# Patient Record
Sex: Female | Born: 1960
Health system: Southern US, Community
[De-identification: ages and names within clinical notes are randomized; demographics above are authoritative.]

## PROBLEM LIST (undated history)

## (undated) DIAGNOSIS — U071 COVID-19: Secondary | ICD-10-CM

## (undated) DIAGNOSIS — E785 Hyperlipidemia, unspecified: Secondary | ICD-10-CM

## (undated) DIAGNOSIS — G8929 Other chronic pain: Secondary | ICD-10-CM

## (undated) DIAGNOSIS — M25579 Pain in unspecified ankle and joints of unspecified foot: Secondary | ICD-10-CM

## (undated) DIAGNOSIS — S3210XA Unspecified fracture of sacrum, initial encounter for closed fracture: Secondary | ICD-10-CM

## (undated) HISTORY — DX: Unspecified fracture of sacrum, initial encounter for closed fracture: S32.10XA

## (undated) HISTORY — DX: Hyperlipidemia, unspecified: E78.5

## (undated) HISTORY — DX: COVID-19: U07.1

## (undated) HISTORY — DX: Other chronic pain: G89.29

## (undated) HISTORY — DX: Pain in unspecified ankle and joints of unspecified foot: M25.579

---

## 1986-08-05 HISTORY — PX: APPENDECTOMY: SHX54

## 2014-11-15 ENCOUNTER — Encounter: Payer: Self-pay | Admitting: *Deleted

## 2015-06-22 ENCOUNTER — Other Ambulatory Visit (HOSPITAL_COMMUNITY)
Admission: RE | Admit: 2015-06-22 | Discharge: 2015-06-22 | Disposition: A | Discharge status: Home / Self Care | Payer: 59 | Source: Ambulatory Visit | Attending: Family Medicine | Admitting: Family Medicine

## 2015-06-22 ENCOUNTER — Encounter: Payer: Self-pay | Admitting: Family Medicine

## 2015-06-22 ENCOUNTER — Ambulatory Visit (INDEPENDENT_AMBULATORY_CARE_PROVIDER_SITE_OTHER)
Admission: RE | Admit: 2015-06-22 | Discharge: 2015-06-22 | Disposition: A | Discharge status: Home / Self Care | Payer: 59 | Source: Ambulatory Visit | Attending: Family Medicine | Admitting: Family Medicine

## 2015-06-22 ENCOUNTER — Ambulatory Visit (INDEPENDENT_AMBULATORY_CARE_PROVIDER_SITE_OTHER): Payer: 59 | Admitting: Family Medicine

## 2015-06-22 VITALS — BP 112/68 | HR 82 | Temp 98.7°F | Ht 60.0 in | Wt 108.1 lb

## 2015-06-22 DIAGNOSIS — Z1151 Encounter for screening for human papillomavirus (HPV): Secondary | ICD-10-CM | POA: Insufficient documentation

## 2015-06-22 DIAGNOSIS — Z130 Encounter for screening for diseases of the blood and blood-forming organs and certain disorders involving the immune mechanism: Secondary | ICD-10-CM | POA: Diagnosis not present

## 2015-06-22 DIAGNOSIS — Z01419 Encounter for gynecological examination (general) (routine) without abnormal findings: Secondary | ICD-10-CM | POA: Diagnosis present

## 2015-06-22 DIAGNOSIS — M25572 Pain in left ankle and joints of left foot: Secondary | ICD-10-CM | POA: Diagnosis not present

## 2015-06-22 DIAGNOSIS — Z0000 Encounter for general adult medical examination without abnormal findings: Secondary | ICD-10-CM | POA: Diagnosis not present

## 2015-06-22 DIAGNOSIS — M545 Low back pain, unspecified: Secondary | ICD-10-CM

## 2015-06-22 DIAGNOSIS — Z1159 Encounter for screening for other viral diseases: Secondary | ICD-10-CM | POA: Diagnosis not present

## 2015-06-22 DIAGNOSIS — Z13220 Encounter for screening for lipoid disorders: Secondary | ICD-10-CM | POA: Diagnosis not present

## 2015-06-22 DIAGNOSIS — G8929 Other chronic pain: Secondary | ICD-10-CM

## 2015-06-22 DIAGNOSIS — Z124 Encounter for screening for malignant neoplasm of cervix: Secondary | ICD-10-CM | POA: Diagnosis not present

## 2015-06-22 DIAGNOSIS — M25579 Pain in unspecified ankle and joints of unspecified foot: Secondary | ICD-10-CM

## 2015-06-22 DIAGNOSIS — Z1239 Encounter for other screening for malignant neoplasm of breast: Secondary | ICD-10-CM

## 2015-06-22 HISTORY — DX: Other chronic pain: G89.29

## 2015-06-22 LAB — COMPREHENSIVE METABOLIC PANEL
ALK PHOS: 59 U/L (ref 39–117)
ALT: 20 U/L (ref 0–35)
AST: 24 U/L (ref 0–37)
Albumin: 4.8 g/dL (ref 3.5–5.2)
BILIRUBIN TOTAL: 0.9 mg/dL (ref 0.2–1.2)
BUN: 15 mg/dL (ref 6–23)
CO2: 29 mEq/L (ref 19–32)
CREATININE: 0.67 mg/dL (ref 0.40–1.20)
Calcium: 10.4 mg/dL (ref 8.4–10.5)
Chloride: 103 mEq/L (ref 96–112)
GFR: 96.59 mL/min (ref >60.00)
GLUCOSE: 100 mg/dL — AB (ref 70–99)
Potassium: 4.4 mEq/L (ref 3.5–5.1)
Sodium: 139 mEq/L (ref 135–145)
TOTAL PROTEIN: 8.3 g/dL (ref 6.0–8.3)

## 2015-06-22 LAB — CBC
HCT: 39.9 % (ref 36.0–46.0)
HEMOGLOBIN: 13.2 g/dL (ref 12.0–15.0)
MCHC: 33.1 g/dL (ref 30.0–36.0)
MCV: 89.7 fl (ref 78.0–100.0)
PLATELETS: 185 10*3/uL (ref 150.0–400.0)
RBC: 4.44 Mil/uL (ref 3.87–5.11)
RDW: 12.7 % (ref 11.5–15.5)
WBC: 7.4 10*3/uL (ref 4.0–10.5)

## 2015-06-22 LAB — LIPID PANEL
CHOL/HDL RATIO: 3
Cholesterol: 251 mg/dL — ABNORMAL HIGH (ref 0–200)
HDL: 78.9 mg/dL (ref >39.00)
LDL Cholesterol: 155 mg/dL — ABNORMAL HIGH (ref 0–99)
NONHDL: 172.42
Triglycerides: 89 mg/dL (ref 0.0–149.0)
VLDL: 17.8 mg/dL (ref 0.0–40.0)

## 2015-06-22 NOTE — Assessment & Plan Note (Signed)
Chronic. Obtaining x-ray for further evaluation. Advised NSAIDs as needed.

## 2015-06-22 NOTE — Assessment & Plan Note (Signed)
Obtaining x-ray for further evaluation. Advised NSAIDs as needed.

## 2015-06-22 NOTE — Assessment & Plan Note (Signed)
Labs today. See orders. Pap smear performed today. Placed order for mammogram. Patient declined colonoscopy. Flu vaccine and tetanus vaccine up-to-date per patient.

## 2015-06-22 NOTE — Patient Instructions (Signed)
It was nice to see you today.   We will call with your lab results.  Go get the xray at the [facility] office.  Follow up annually or sooner if needed.  Take care  Dr. Lacinda Axon  Health Maintenance, Female Adopting a healthy lifestyle and getting preventive care can go a long way to promote health and wellness. Talk with your health care provider about what schedule of regular examinations is right for you. This is a good chance for you to check in with your provider about disease prevention and staying healthy. In between checkups, there are plenty of things you can do on your own. Experts have done a lot of research about which lifestyle changes and preventive measures are most likely to keep you healthy. Ask your health care provider for more information. WEIGHT AND DIET  Eat a healthy diet  Be sure to include plenty of vegetables, fruits, low-fat dairy products, and lean protein.  Do not eat a lot of foods high in solid fats, added sugars, or salt.  Get regular exercise. This is one of the most important things you can do for your health.  Most adults should exercise for at least 150 minutes each week. The exercise should increase your heart rate and make you sweat (moderate-intensity exercise).  Most adults should also do strengthening exercises at least twice a week. This is in addition to the moderate-intensity exercise.  Maintain a healthy weight  Body mass index (BMI) is a measurement that can be used to identify possible weight problems. It estimates body fat based on height and weight. Your health care provider can help determine your BMI and help you achieve or maintain a healthy weight.  For females 82 years of age and older:   A BMI below 18.5 is considered underweight.  A BMI of 18.5 to 24.9 is normal.  A BMI of 25 to 29.9 is considered overweight.  A BMI of 30 and above is considered obese.  Watch levels of cholesterol and blood lipids  You should start  having your blood tested for lipids and cholesterol at 56 years of age, then have this test every 5 years.  You may need to have your cholesterol levels checked more often if:  Your lipid or cholesterol levels are high.  You are older than 56 years of age.  You are at high risk for heart disease.  CANCER SCREENING   Lung Cancer  Lung cancer screening is recommended for adults 75-16 years old who are at high risk for lung cancer because of a history of smoking.  A yearly low-dose CT scan of the lungs is recommended for people who:  Currently smoke.  Have quit within the past 15 years.  Have at least a 30-pack-year history of smoking. A pack year is smoking an average of one pack of cigarettes a day for 1 year.  Yearly screening should continue until it has been 15 years since you quit.  Yearly screening should stop if you develop a health problem that would prevent you from having lung cancer treatment.  Breast Cancer  Practice breast self-awareness. This means understanding how your breasts normally appear and feel.  It also means doing regular breast self-exams. Let your health care provider know about any changes, no matter how small.  If you are in your 20s or 30s, you should have a clinical breast exam (CBE) by a health care provider every 1-3 years as part of a regular health exam.  If  you are 40 or older, have a CBE every year. Also consider having a breast X-ray (mammogram) every year.  If you have a family history of breast cancer, talk to your health care provider about genetic screening.  If you are at high risk for breast cancer, talk to your health care provider about having an MRI and a mammogram every year.  Breast cancer gene (BRCA) assessment is recommended for women who have family members with BRCA-related cancers. BRCA-related cancers include:  Breast.  Ovarian.  Tubal.  Peritoneal cancers.  Results of the assessment will determine the need for  genetic counseling and BRCA1 and BRCA2 testing. Cervical Cancer Your health care provider may recommend that you be screened regularly for cancer of the pelvic organs (ovaries, uterus, and vagina). This screening involves a pelvic examination, including checking for microscopic changes to the surface of your cervix (Pap test). You may be encouraged to have this screening done every 3 years, beginning at age 31.  For women ages 20-65, health care providers may recommend pelvic exams and Pap testing every 3 years, or they may recommend the Pap and pelvic exam, combined with testing for human papilloma virus (HPV), every 5 years. Some types of HPV increase your risk of cervical cancer. Testing for HPV may also be done on women of any age with unclear Pap test results.  Other health care providers may not recommend any screening for nonpregnant women who are considered low risk for pelvic cancer and who do not have symptoms. Ask your health care provider if a screening pelvic exam is right for you.  If you have had past treatment for cervical cancer or a condition that could lead to cancer, you need Pap tests and screening for cancer for at least 20 years after your treatment. If Pap tests have been discontinued, your risk factors (such as having a new sexual partner) need to be reassessed to determine if screening should resume. Some women have medical problems that increase the chance of getting cervical cancer. In these cases, your health care provider may recommend more frequent screening and Pap tests. Colorectal Cancer  This type of cancer can be detected and often prevented.  Routine colorectal cancer screening usually begins at 56 years of age and continues through 56 years of age.  Your health care provider may recommend screening at an earlier age if you have risk factors for colon cancer.  Your health care provider may also recommend using home test kits to check for hidden blood in the  stool.  A small camera at the end of a tube can be used to examine your colon directly (sigmoidoscopy or colonoscopy). This is done to check for the earliest forms of colorectal cancer.  Routine screening usually begins at age 76.  Direct examination of the colon should be repeated every 5-10 years through 56 years of age. However, you may need to be screened more often if early forms of precancerous polyps or small growths are found. Skin Cancer  Check your skin from head to toe regularly.  Tell your health care provider about any new moles or changes in moles, especially if there is a change in a mole's shape or color.  Also tell your health care provider if you have a mole that is larger than the size of a pencil eraser.  Always use sunscreen. Apply sunscreen liberally and repeatedly throughout the day.  Protect yourself by wearing long sleeves, pants, a wide-brimmed hat, and sunglasses whenever you are  outside. HEART DISEASE, DIABETES, AND HIGH BLOOD PRESSURE   High blood pressure causes heart disease and increases the risk of stroke. High blood pressure is more likely to develop in:  People who have blood pressure in the high end of the normal range (130-139/85-89 mm Hg).  People who are overweight or obese.  People who are African American.  If you are 18-39 years of age, have your blood pressure checked every 3-5 years. If you are 40 years of age or older, have your blood pressure checked every year. You should have your blood pressure measured twice--once when you are at a hospital or clinic, and once when you are not at a hospital or clinic. Record the average of the two measurements. To check your blood pressure when you are not at a hospital or clinic, you can use:  An automated blood pressure machine at a pharmacy.  A home blood pressure monitor.  If you are between 55 years and 79 years old, ask your health care provider if you should take aspirin to prevent  strokes.  Have regular diabetes screenings. This involves taking a blood sample to check your fasting blood sugar level.  If you are at a normal weight and have a low risk for diabetes, have this test once every three years after 56 years of age.  If you are overweight and have a high risk for diabetes, consider being tested at a younger age or more often. PREVENTING INFECTION  Hepatitis B  If you have a higher risk for hepatitis B, you should be screened for this virus. You are considered at high risk for hepatitis B if:  You were born in a country where hepatitis B is common. Ask your health care provider which countries are considered high risk.  Your parents were born in a high-risk country, and you have not been immunized against hepatitis B (hepatitis B vaccine).  You have HIV or AIDS.  You use needles to inject street drugs.  You live with someone who has hepatitis B.  You have had sex with someone who has hepatitis B.  You get hemodialysis treatment.  You take certain medicines for conditions, including cancer, organ transplantation, and autoimmune conditions. Hepatitis C  Blood testing is recommended for:  Everyone born from 1945 through 1965.  Anyone with known risk factors for hepatitis C. Sexually transmitted infections (STIs)  You should be screened for sexually transmitted infections (STIs) including gonorrhea and chlamydia if:  You are sexually active and are younger than 56 years of age.  You are older than 56 years of age and your health care provider tells you that you are at risk for this type of infection.  Your sexual activity has changed since you were last screened and you are at an increased risk for chlamydia or gonorrhea. Ask your health care provider if you are at risk.  If you do not have HIV, but are at risk, it may be recommended that you take a prescription medicine daily to prevent HIV infection. This is called pre-exposure prophylaxis  (PrEP). You are considered at risk if:  You are sexually active and do not regularly use condoms or know the HIV status of your partner(s).  You take drugs by injection.  You are sexually active with a partner who has HIV. Talk with your health care provider about whether you are at high risk of being infected with HIV. If you choose to begin PrEP, you should first be tested for HIV.   You should then be tested every 3 months for as long as you are taking PrEP.  PREGNANCY   If you are premenopausal and you may become pregnant, ask your health care provider about preconception counseling.  If you may become pregnant, take 400 to 800 micrograms (mcg) of folic acid every day.  If you want to prevent pregnancy, talk to your health care provider about birth control (contraception). OSTEOPOROSIS AND MENOPAUSE   Osteoporosis is a disease in which the bones lose minerals and strength with aging. This can result in serious bone fractures. Your risk for osteoporosis can be identified using a bone density scan.  If you are 36 years of age or older, or if you are at risk for osteoporosis and fractures, ask your health care provider if you should be screened.  Ask your health care provider whether you should take a calcium or vitamin D supplement to lower your risk for osteoporosis.  Menopause may have certain physical symptoms and risks.  Hormone replacement therapy may reduce some of these symptoms and risks. Talk to your health care provider about whether hormone replacement therapy is right for you.  HOME CARE INSTRUCTIONS   Schedule regular health, dental, and eye exams.  Stay current with your immunizations.   Do not use any tobacco products including cigarettes, chewing tobacco, or electronic cigarettes.  If you are pregnant, do not drink alcohol.  If you are breastfeeding, limit how much and how often you drink alcohol.  Limit alcohol intake to no more than 1 drink per day for  nonpregnant women. One drink equals 12 ounces of beer, 5 ounces of wine, or 1 ounces of hard liquor.  Do not use street drugs.  Do not share needles.  Ask your health care provider for help if you need support or information about quitting drugs.  Tell your health care provider if you often feel depressed.  Tell your health care provider if you have ever been abused or do not feel safe at home.   This information is not intended to replace advice given to you by your health care provider. Make sure you discuss any questions you have with your health care provider.   Document Released: 02/04/2011 Document Revised: 08/12/2014 Document Reviewed: 06/23/2013 Elsevier Interactive Patient Education [employer].

## 2015-06-22 NOTE — Progress Notes (Signed)
Pre visit review using our clinic review tool, if applicable. No additional management support is needed unless otherwise documented below in the visit note. 

## 2015-06-22 NOTE — Progress Notes (Signed)
Subjective:  Patient ID: Cynthia Zuniga, female    DOB: Dec 28, 1960  Age: 54 y.o. MRN: [id number]  CC: Establish care; Back pain; Ankle/foot pain  HPI Cynthia Zuniga is a 54 y.o. female presents to the clinic today to establish care. She also has complaints of back pain and foot/ankle pain.  Preventative Healthcare  Pap smear: Due. Will complete today.   Mammogram/Breast exam: In need of mammogram. Order placed.  Colonoscopy: Declined.  Immunizations  Tetanus - patient states that she's had a tetanus in the last 10 years but is unsure of when exactly it was.  Pneumococcal - no indication for this.  Flu - as ordered received a flu vaccine earlier this year.  Hepatitis C screening - in need of screening.  Labs: In need of annual labs. See orders.  Alcohol use: No.   Smoking/tobacco use: No.    Back pain  Has been having low back pain for approximately a year or more.  Pain is located in the midline with no radiation to the lower extremtities.  Worse with activity.  Has had some relief with Ibuprofen.  No other sensory symptoms. No reports of bladder or bowel incontinence.  Left ankle/foot pain  Chronic. She states has been going on for years.  She states that it began after a fracture and subsequent surgery.  No exacerbating or relieving factors.  She does take anti-inflammatories occasionally.  PMH, Surgical Hx, Family Hx, Social History reviewed and updated as below.  History reviewed. No pertinent past medical history.  No PMH per patient.   Past Surgical History  Procedure Laterality Date  . Appendectomy  1988   Family History  Problem Relation Age of Onset  . Heart disease Mother   . Heart disease Father   . Stroke Paternal Grandfather   . Hypertension Paternal Grandfather   . Diabetes Paternal Grandfather    Social History  Substance Use Topics  . Smoking status: Never Smoker   . Smokeless tobacco: Never Used  . Alcohol Use: No    Review of Systems  Musculoskeletal: Positive for back pain.       Ankle/foot pain (left).  All other systems reviewed and are negative.  Objective:   Today's Vitals: BP 112/68 mmHg  Pulse 82  Temp(Src) 98.7 F (37.1 C) (Oral)  Ht 5' (1.524 m)  Wt 108 lb 2 oz (49.045 kg)  BMI 21.12 kg/m2  SpO2 98%  LMP 05/12/2015  Physical Exam  Constitutional: She is oriented to person, place, and time. She appears well-developed and well-nourished. No distress.  HENT:  Head: Normocephalic and atraumatic.  Nose: Nose normal.  Mouth/Throat: Oropharynx is clear and moist. No oropharyngeal exudate.  Normal TM's bilaterally.   Eyes: Conjunctivae are normal. No scleral icterus.  Neck: Neck supple.  Cardiovascular: Normal rate and regular rhythm.   No murmur heard. Pulmonary/Chest: Effort normal and breath sounds normal. She has no wheezes. She has no rales.  Abdominal: Soft. She exhibits no distension. There is no tenderness. There is no rebound and no guarding.  Genitourinary:  Pelvic Exam: External: normal female genitalia without lesions or masses. Vagina: normal without lesions or masses Cervix: normal without lesions or masses Pap smear: performed   Musculoskeletal:  Back - Lumbar spine mildly tender to palpation. Negative straight leg raise.  Left ankle - tender around the lateral malleolus. No ligament laxity appreciated.   Lymphadenopathy:    She has no cervical adenopathy.  Neurological: She is alert and oriented to person, place, and  time.  Skin: Skin is warm and dry. No rash noted.  Psychiatric: She has a normal mood and affect.  Vitals reviewed.  Assessment & Plan:   Problem List Items Addressed This Visit    Preventative health care - Primary    Labs today. See orders. Pap smear performed today. Placed order for mammogram. Patient declined colonoscopy. Flu vaccine and tetanus vaccine up-to-date per patient.      Low back pain    Chronic. Obtaining x-ray for  further evaluation. Advised NSAIDs as needed.      Relevant Orders   Comp Met (CMET)   DG Lumbar Spine Complete   Ankle pain, chronic    Obtaining x-ray for further evaluation. Advised NSAIDs as needed.      Relevant Orders   DG Ankle Complete Left    Other Visit Diagnoses    Pap smear for cervical cancer screening        Relevant Orders    Cytology - PAP    Need for hepatitis C screening test        Relevant Orders    Hepatitis C Antibody    Screening for lipid disorders        Relevant Orders    Lipid panel    Screening for deficiency anemia        Relevant Orders    CBC    Screening for breast cancer        Relevant Orders    MM Digital Screening      Follow-up: Annually or sooner if needed.   [employer]

## 2015-06-23 LAB — HEPATITIS C ANTIBODY: HCV AB: NEGATIVE

## 2015-06-26 LAB — CYTOLOGY - PAP

## 2015-07-10 ENCOUNTER — Ambulatory Visit
Admission: RE | Admit: 2015-07-10 | Discharge: 2015-07-10 | Disposition: A | Discharge status: Home / Self Care | Payer: 59 | Source: Ambulatory Visit | Attending: Family Medicine | Admitting: Family Medicine

## 2015-07-10 DIAGNOSIS — Z1231 Encounter for screening mammogram for malignant neoplasm of breast: Secondary | ICD-10-CM | POA: Insufficient documentation

## 2015-07-10 DIAGNOSIS — Z1239 Encounter for other screening for malignant neoplasm of breast: Secondary | ICD-10-CM

## 2015-09-25 ENCOUNTER — Other Ambulatory Visit: Payer: Self-pay | Admitting: Family Medicine

## 2015-09-25 ENCOUNTER — Telehealth: Payer: Self-pay | Admitting: *Deleted

## 2015-09-25 ENCOUNTER — Other Ambulatory Visit (INDEPENDENT_AMBULATORY_CARE_PROVIDER_SITE_OTHER): Payer: 59

## 2015-09-25 ENCOUNTER — Other Ambulatory Visit: Payer: Self-pay | Admitting: *Deleted

## 2015-09-25 DIAGNOSIS — E785 Hyperlipidemia, unspecified: Secondary | ICD-10-CM | POA: Diagnosis not present

## 2015-09-25 LAB — LIPID PANEL
CHOLESTEROL: 251 mg/dL — AB (ref 0–200)
HDL: 68.1 mg/dL (ref >39.00)
LDL Cholesterol: 168 mg/dL — ABNORMAL HIGH (ref 0–99)
NonHDL: 183.38
Total CHOL/HDL Ratio: 4
Triglycerides: 78 mg/dL (ref 0.0–149.0)
VLDL: 15.6 mg/dL (ref 0.0–40.0)

## 2015-09-25 LAB — LDL CHOLESTEROL, DIRECT: LDL DIRECT: 153 mg/dL

## 2015-09-25 NOTE — Telephone Encounter (Signed)
As been placed.

## 2015-09-25 NOTE — Telephone Encounter (Signed)
Orders in 

## 2015-09-25 NOTE — Telephone Encounter (Signed)
Labs and dx?  

## 2015-09-29 ENCOUNTER — Telehealth: Payer: Self-pay

## 2015-09-29 NOTE — Telephone Encounter (Signed)
Mailed results to patient.  Thanks

## 2015-09-29 NOTE — Telephone Encounter (Signed)
Cholesterol is mildly elevated.  Can wait to discuss with Dr Adriana Simas.  Ok to mail labs

## 2015-09-29 NOTE — Telephone Encounter (Signed)
Patient is requesting her lab results, once reviewed she would like them mailed to her.

## 2015-10-03 ENCOUNTER — Telehealth: Payer: Self-pay | Admitting: Family Medicine

## 2015-10-03 NOTE — Telephone Encounter (Signed)
Notified pt and pt's husband requested that we send him a copy of recent labs in the mail. I also explained that Dr. Adriana Simas is out of the office and upon his return he discuss a treatment plan with the pt.

## 2015-10-03 NOTE — Telephone Encounter (Signed)
Reviewed her 09/25/15 labs.  Her cholesterol is still elevated.  Please let her know that Dr Adriana Simas is out of the office and can discuss treatment upon his return.  If any problems or questions let me know.  Can forward message to him for FYI.  Thanks

## 2015-10-03 NOTE — Telephone Encounter (Signed)
Pt is requesting lab results. Please advise, thanks

## 2015-10-03 NOTE — Telephone Encounter (Signed)
Pt husband called to check the status of his wife lab results. Pt got labs done on 09/25/2015. Call husband @ [phone]. Thank you!

## 2015-10-04 NOTE — Telephone Encounter (Signed)
Please schedule a visit with Dr. Cook <MEAAdriana SimasUREMENT>

## 2015-10-04 NOTE — Telephone Encounter (Signed)
LMOMTCB

## 2015-10-05 NOTE — Telephone Encounter (Signed)
Pt scheduled 10/20/15 at 8:00am

## 2015-10-05 NOTE — Telephone Encounter (Signed)
LMOMTCB, pt need 30 min appt

## 2015-10-18 ENCOUNTER — Telehealth: Payer: Self-pay | Admitting: Family Medicine

## 2015-10-18 NOTE — Telephone Encounter (Signed)
Called patient to ask about her appointment on Friday 10/20/15 dealing with cholesterol. Patient earlier last month stated she wanted to diet exercise instead of medications.  Would she like to start medication or keep diet and exercising.

## 2015-10-19 NOTE — Telephone Encounter (Signed)
Patient was called and she wanted to start cholesterol medication. We will be calling that in so she doesn't not need to be seen.

## 2015-10-19 NOTE — Telephone Encounter (Signed)
Pt called returning your call from 10/18/15. Call pt @ [phone]. Thank you!

## 2015-10-20 ENCOUNTER — Other Ambulatory Visit: Payer: Self-pay

## 2015-10-20 ENCOUNTER — Ambulatory Visit: Payer: 59 | Admitting: Family Medicine

## 2015-10-20 MED ORDER — ATORVASTATIN CALCIUM 40 MG PO TABS: 40.0000 mg | ORAL_TABLET | Freq: Every day | ORAL | Status: DC

## 2015-10-20 NOTE — Telephone Encounter (Signed)
Patient stated that the pharmacy did not receive her Rx. Pharmacy [facility] [address]

## 2015-10-20 NOTE — Telephone Encounter (Signed)
Patient was called and Dr.Sonnenberg co-signed to order the prescription for the medication Dr.Cook wanted to order. Patient was told that her medication was now at pharmacy.

## 2015-10-20 NOTE — Telephone Encounter (Signed)
Ashleigh where did you call in this medication? Pt called the office back and stated that they did not receive. Please advise, thanks

## 2015-11-13 ENCOUNTER — Telehealth: Payer: Self-pay | Admitting: *Deleted

## 2015-11-13 ENCOUNTER — Other Ambulatory Visit: Payer: Self-pay | Admitting: Family Medicine

## 2015-11-13 DIAGNOSIS — E785 Hyperlipidemia, unspecified: Secondary | ICD-10-CM

## 2015-11-13 NOTE — Telephone Encounter (Signed)
Pt called returning your call. Call pt @ [phone]. Thank you!

## 2015-11-13 NOTE — Telephone Encounter (Signed)
Called pt and lvtcb.

## 2015-11-13 NOTE — Telephone Encounter (Signed)
Patient's husband stated that his wife has gas(belching). The belcing did not start until she started the lipitor 40 mg. He questioned if this could be a side effect of the medication. Contact [phone]

## 2015-11-13 NOTE — Telephone Encounter (Signed)
Pt was called and she stated that she wanted repeated albs to check to see if medication was working. She will try the Gas-X and prilosec and see if it helps.

## 2015-11-13 NOTE — Telephone Encounter (Signed)
Belching could be side effect, please advise?

## 2015-11-13 NOTE — Telephone Encounter (Signed)
Gas/dyspepsia likely a side effect. PRN Gas X and/or Prilosec.

## 2015-12-01 ENCOUNTER — Other Ambulatory Visit (INDEPENDENT_AMBULATORY_CARE_PROVIDER_SITE_OTHER): Payer: 59

## 2015-12-01 DIAGNOSIS — E785 Hyperlipidemia, unspecified: Secondary | ICD-10-CM

## 2015-12-01 LAB — LIPID PANEL
CHOL/HDL RATIO: 3
CHOLESTEROL: 191 mg/dL (ref 0–200)
HDL: 59.3 mg/dL (ref >39.00)
LDL Cholesterol: 118 mg/dL — ABNORMAL HIGH (ref 0–99)
NonHDL: 131.58
TRIGLYCERIDES: 69 mg/dL (ref 0.0–149.0)
VLDL: 13.8 mg/dL (ref 0.0–40.0)

## 2015-12-06 ENCOUNTER — Telehealth: Payer: Self-pay | Admitting: *Deleted

## 2015-12-06 ENCOUNTER — Other Ambulatory Visit: Payer: Self-pay | Admitting: Family Medicine

## 2015-12-06 MED ORDER — ROSUVASTATIN CALCIUM 10 MG PO TABS: 10.0000 mg | ORAL_TABLET | Freq: Every day | ORAL | Status: DC

## 2015-12-06 NOTE — Telephone Encounter (Signed)
Patient requested lab results  Pt contact [phone]

## 2015-12-06 NOTE — Telephone Encounter (Signed)
Patient [facility]

## 2015-12-06 NOTE — Telephone Encounter (Signed)
Cynthia Zuniga, please advise. Thanks 

## 2016-05-23 DIAGNOSIS — H5203 Hypermetropia, bilateral: Secondary | ICD-10-CM | POA: Diagnosis not present

## 2016-05-23 DIAGNOSIS — H52223 Regular astigmatism, bilateral: Secondary | ICD-10-CM | POA: Diagnosis not present

## 2016-05-23 DIAGNOSIS — H524 Presbyopia: Secondary | ICD-10-CM | POA: Diagnosis not present

## 2016-07-04 ENCOUNTER — Telehealth: Payer: Self-pay | Admitting: Family Medicine

## 2016-07-04 NOTE — Telephone Encounter (Signed)
FYI

## 2016-07-04 NOTE — Telephone Encounter (Signed)
Patient Name: Cynthia Zuniga DOB: [birth year] Initial Comment Caller states, his wife is having dizziness. Verified Nurse Assessment Nurse: [name], RN, [name] Date/Time (Eastern Time): 07/04/2016 9:56:30 AM Confirm and document reason for call. If symptomatic, describe symptoms. ---Caller states, she is having some dizziness for 2 weeks. Caller denies other symptoms. Does the patient have any new or worsening symptoms? ---Yes Will a triage be completed? ---Yes Related visit to physician within the last 2 weeks? ---No Does the PT have any chronic conditions? (i.e. diabetes, asthma, etc.) ---Yes List chronic conditions. ---high cholesterol Is this a behavioral health or substance abuse call? ---No Guidelines Guideline Title Affirmed Question Affirmed Notes Dizziness - Vertigo [1] MODERATE dizziness (e.g., vertigo; feels very unsteady, interferes with normal activities) AND [2] has NOT been evaluated by physician for this Final Disposition User See Physician within 24 Hours Hensel, RN, [name] Comments Caller denies any current dizziness. Referrals REFERRED TO PCP OFFICE Disagree/Comply: Comply

## 2016-07-04 NOTE — Telephone Encounter (Signed)
Please call patient

## 2016-07-04 NOTE — Telephone Encounter (Signed)
Pt husband called and stated that pt woke up with dizziness this morning. Sent call to [facility] Triage.

## 2016-07-04 NOTE — Telephone Encounter (Signed)
Patient has appointment with Dr. Adriana Simasook tomorrow.

## 2016-07-04 NOTE — Telephone Encounter (Signed)
Looks like appointment already scheduled 07/05/16

## 2016-07-05 ENCOUNTER — Encounter: Payer: Self-pay | Admitting: Family Medicine

## 2016-07-05 ENCOUNTER — Ambulatory Visit (INDEPENDENT_AMBULATORY_CARE_PROVIDER_SITE_OTHER): Payer: 59 | Admitting: Family Medicine

## 2016-07-05 DIAGNOSIS — R42 Dizziness and giddiness: Secondary | ICD-10-CM | POA: Insufficient documentation

## 2016-07-05 DIAGNOSIS — E785 Hyperlipidemia, unspecified: Secondary | ICD-10-CM | POA: Insufficient documentation

## 2016-07-05 HISTORY — DX: Hyperlipidemia, unspecified: E78.5

## 2016-07-05 IMAGING — MG MM DIGITAL SCREENING BILAT W/ CAD
4 series · 4 of 4 positions shown · non-contrast
Comparison: None.

CLINICAL DATA: Screening.

EXAM:
DIGITAL SCREENING BILATERAL MAMMOGRAM WITH CAD

[L MLO]
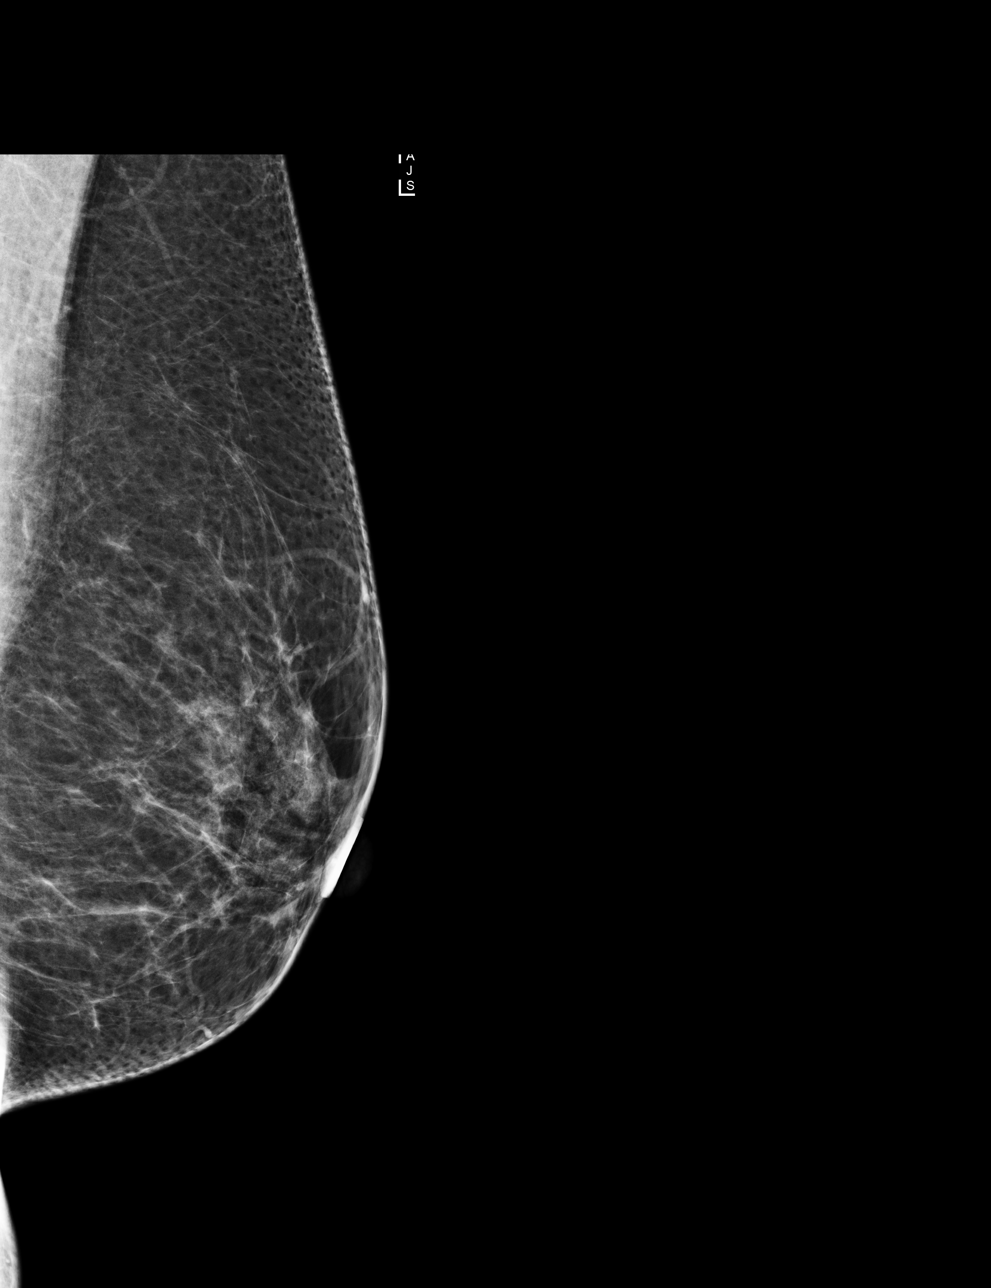

[L CC]
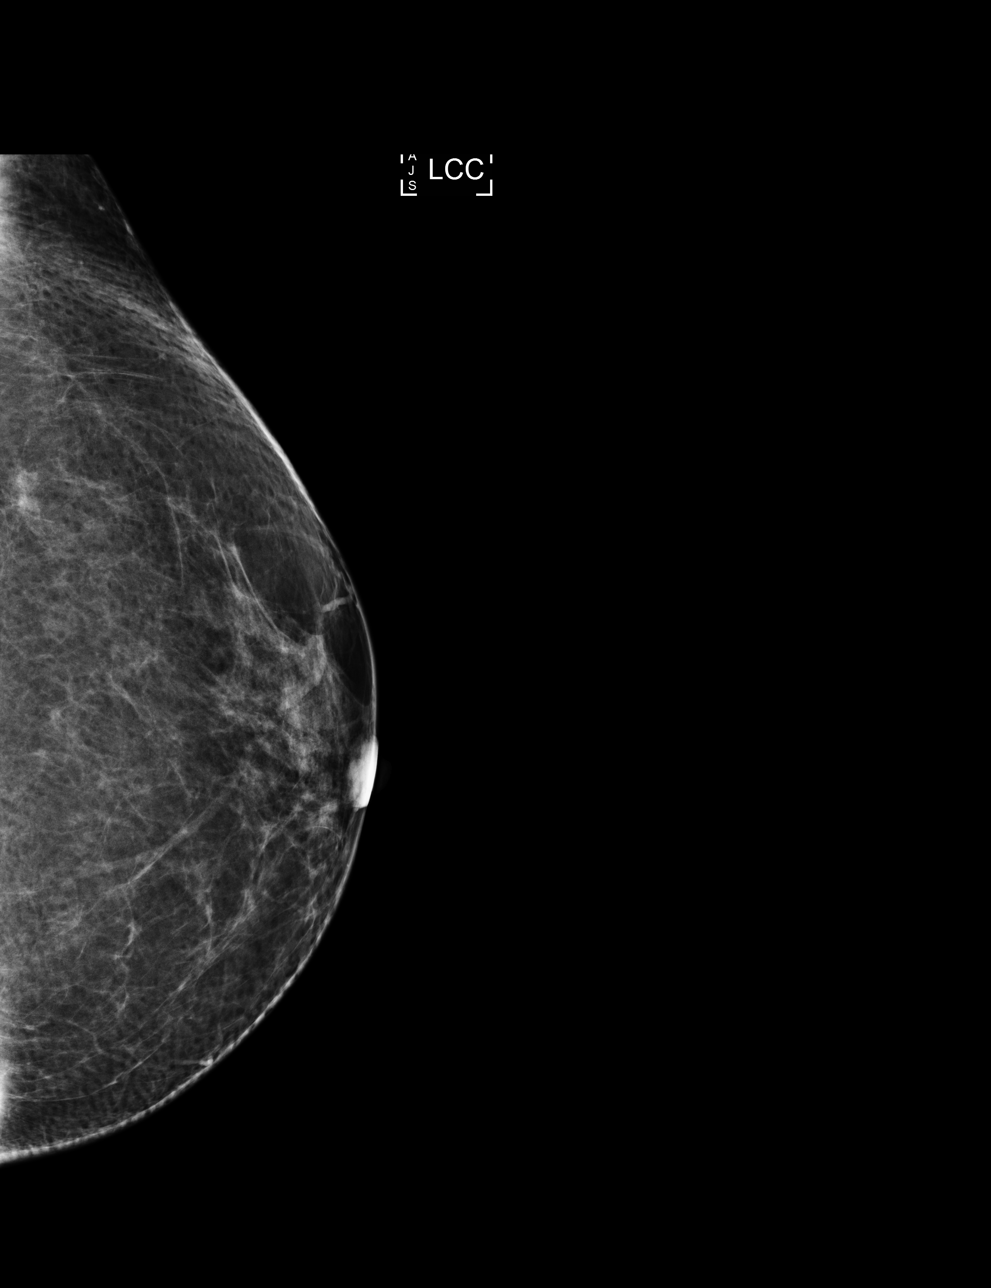

[R CC]
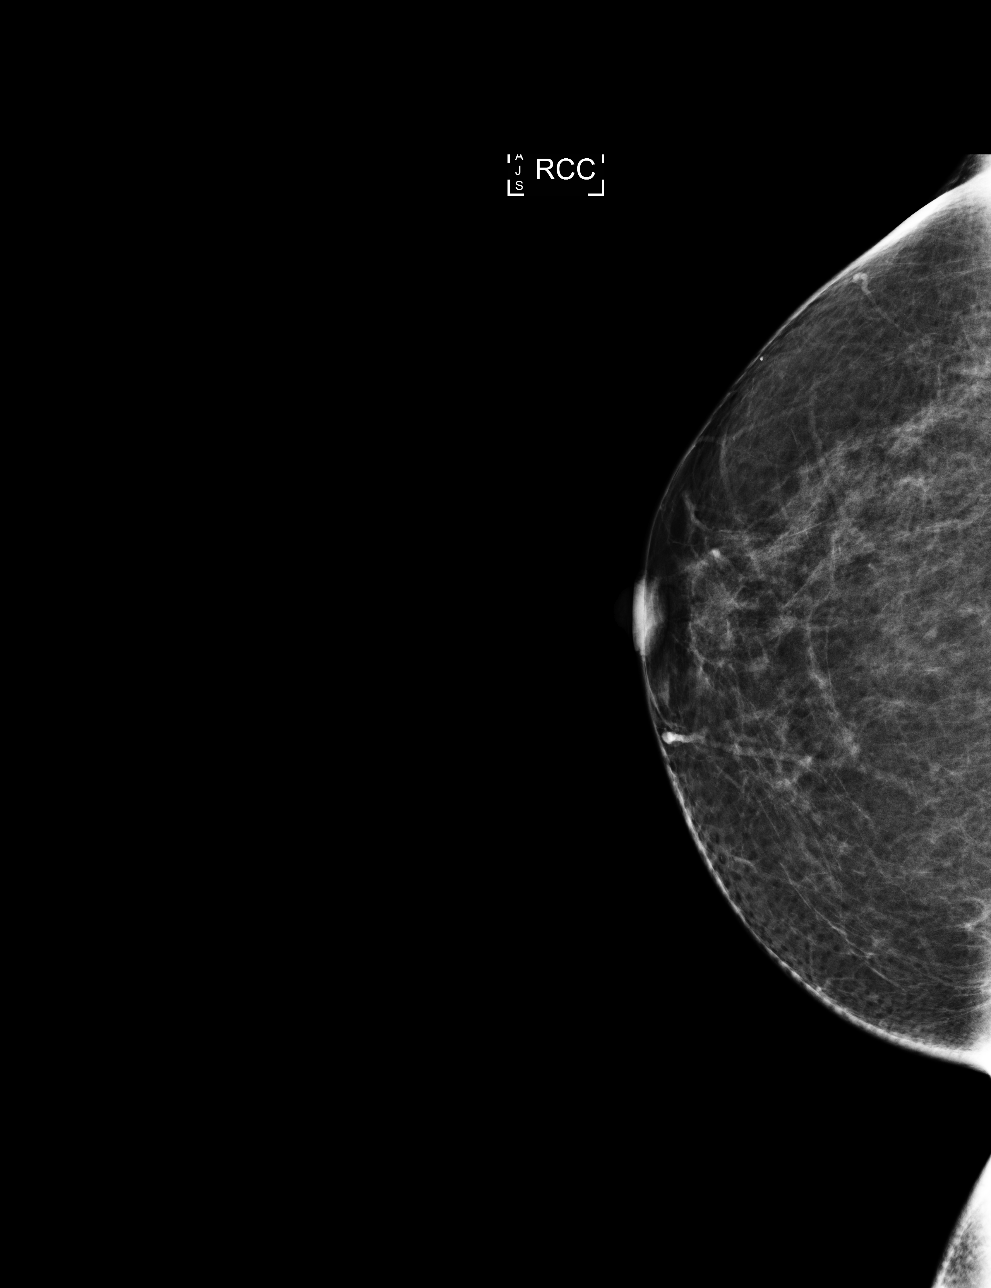

[R MLO]
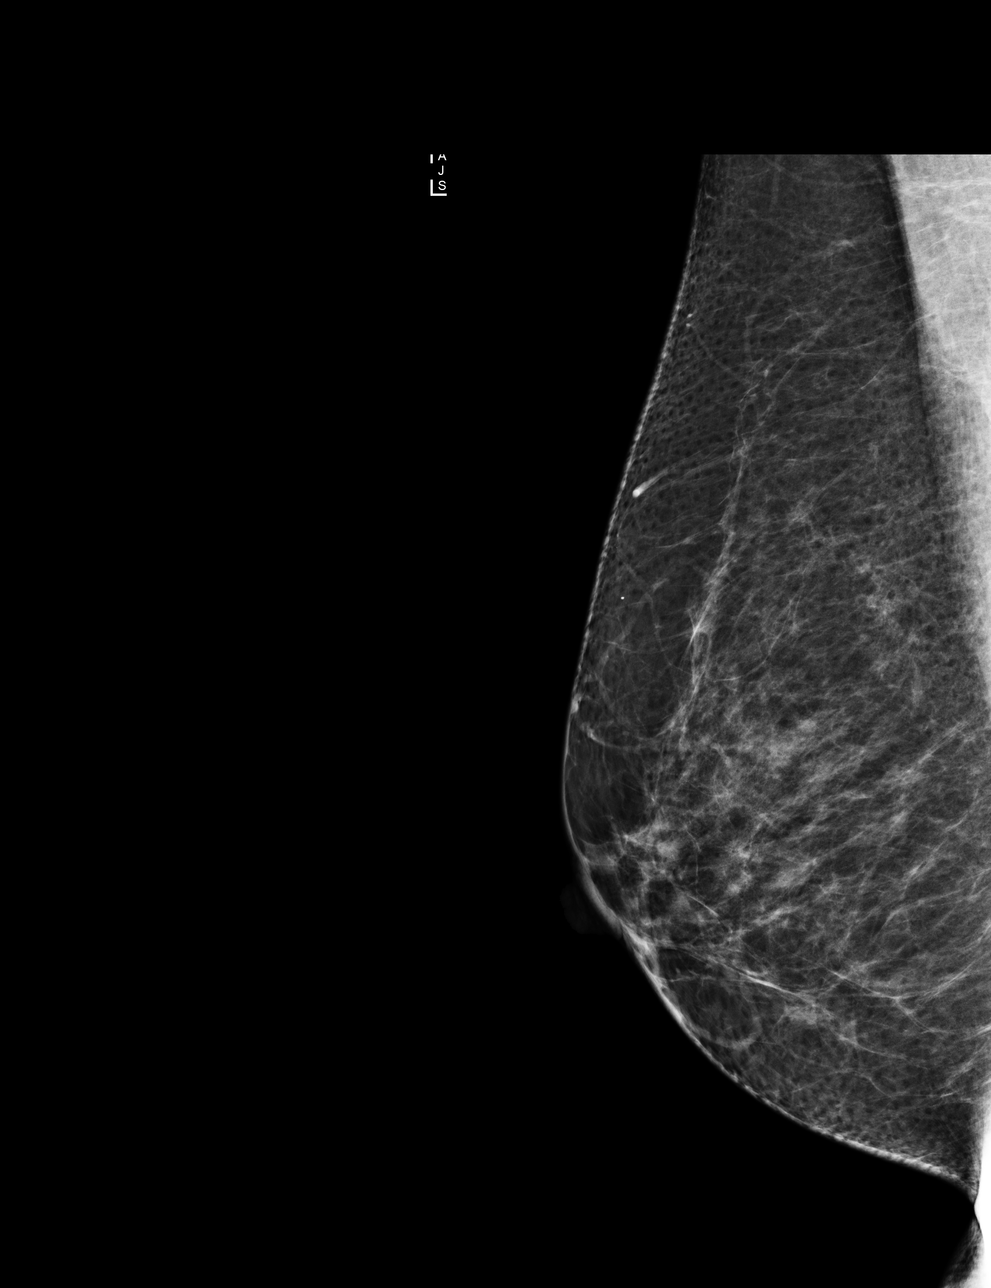

[4 of 4 positions shown; findings below may reference images not displayed]

ACR Breast Density Category b: There are scattered areas of
fibroglandular density.
FINDINGS: There are no findings suspicious for malignancy. Images were
processed with CAD.
IMPRESSION: No mammographic evidence of malignancy. A result letter of this
screening mammogram will be mailed directly to the patient.

RECOMMENDATION:
Screening mammogram in one year. (Code:[id number])

BI-RADS CATEGORY  1: Negative.

## 2016-07-05 MED ORDER — MECLIZINE HCL 25 MG PO TABS: 25.0000 mg | ORAL_TABLET | Freq: Three times a day (TID) | ORAL | 0 refills | Status: DC | PRN

## 2016-07-05 NOTE — Progress Notes (Signed)
   Subjective:  Patient ID: Cynthia Zuniga, female    DOB: 1960/10/29  Age: 55 y.o. MRN: [id number]  CC: Dizziness  HPI:  55 year old female presents with complaints of dizziness.  Dizziness  Has been going on for 3 weeks.  No known inciting factor.  Very brief and last seconds.  Occurs several times a day.  Occurs particularly with range of motion of the head and neck.  No vision changes.  No associated nausea or vomiting.  No medications or interventions tried.  No other associated symptoms.  No other complaints at this time.  Social Hx   Social History   Social History  . Marital status: Married    Spouse name: N/A  . Number of children: N/A  . Years of education: N/A   Social History Main Topics  . Smoking status: Never Smoker  . Smokeless tobacco: Never Used  . Alcohol use No  . Drug use: No  . Sexual activity: Not Asked   Other Topics Concern  . None   Social History Narrative  . None   Review of Systems  Gastrointestinal: Negative.   Neurological: Positive for dizziness.   Objective:  BP 122/72 (BP Location: Left Arm, Patient Position: Sitting, Cuff Size: Normal)   Pulse 91   Temp 98 F (36.7 C) (Oral)   Resp 10   Wt 108 lb 2 oz (49 kg)   SpO2 98%   BMI 21.12 kg/m   BP/Weight 07/05/2016 06/22/2015  Systolic BP 122 112  Diastolic BP 72 68  Wt. (Lbs) 108.13 108.13  BMI 21.12 21.12   Physical Exam  Constitutional: She is oriented to person, place, and time. She appears well-developed. No distress.  Cardiovascular: Normal rate and regular rhythm.   2/6 systolic murmur.  Pulmonary/Chest: Effort normal. She has no wheezes. She has no rales.  Neurological: She is alert and oriented to person, place, and time. No cranial nerve deficit.  Negative Dix-Hallpike  Psychiatric: She has a normal mood and affect.  Vitals reviewed.  Lab Results  Component Value Date   WBC 7.4 06/22/2015   HGB 13.2 06/22/2015   HCT 39.9 06/22/2015   PLT  185.0 06/22/2015   GLUCOSE 100 (H) 06/22/2015   CHOL 191 12/01/2015   TRIG 69.0 12/01/2015   HDL 59.30 12/01/2015   LDLDIRECT 153.0 09/25/2015   LDLCALC 118 (H) 12/01/2015   ALT 20 06/22/2015   AST 24 06/22/2015   NA 139 06/22/2015   K 4.4 06/22/2015   CL 103 06/22/2015   CREATININE 0.67 06/22/2015   BUN 15 06/22/2015   CO2 29 06/22/2015    Assessment & Plan:   Problem List Items Addressed This Visit    Dizziness    New problem. Suspect benign etiology. Likely vertigo. Treating with meclizine. If fails to improve with send to vestibular rehabilitation.        Meds ordered this encounter  Medications  . meclizine (ANTIVERT) 25 MG tablet    Sig: Take 1 tablet (25 mg total) by mouth 3 (three) times daily as needed for dizziness.    Dispense:  30 tablet    Refill:  0    Follow-up: PRN  Everlene OtherJayce Vyctoria Dickman DO [facility] [place]

## 2016-07-05 NOTE — Assessment & Plan Note (Signed)
New problem. Suspect benign etiology. Likely vertigo. Treating with meclizine. If fails to improve with send to vestibular rehabilitation.

## 2016-07-05 NOTE — Patient Instructions (Signed)
Take the medication as prescribed.  If it persists, I'll send you to vestibular rehab.  Take care  Dr. Adriana Simasook

## 2016-12-26 ENCOUNTER — Ambulatory Visit (INDEPENDENT_AMBULATORY_CARE_PROVIDER_SITE_OTHER): Payer: 59 | Admitting: Family Medicine

## 2016-12-26 ENCOUNTER — Encounter: Payer: Self-pay | Admitting: Family Medicine

## 2016-12-26 VITALS — BP 130/78 | HR 72 | Temp 98.2°F | Ht 60.0 in | Wt 106.4 lb

## 2016-12-26 DIAGNOSIS — Z0000 Encounter for general adult medical examination without abnormal findings: Secondary | ICD-10-CM | POA: Insufficient documentation

## 2016-12-26 DIAGNOSIS — Z1231 Encounter for screening mammogram for malignant neoplasm of breast: Secondary | ICD-10-CM

## 2016-12-26 LAB — CBC
HCT: 37.9 % (ref 36.0–46.0)
HEMOGLOBIN: 12.8 g/dL (ref 12.0–15.0)
MCHC: 33.8 g/dL (ref 30.0–36.0)
MCV: 88.4 fl (ref 78.0–100.0)
PLATELETS: 131 10*3/uL — AB (ref 150.0–400.0)
RBC: 4.29 Mil/uL (ref 3.87–5.11)
RDW: 13.2 % (ref 11.5–15.5)
WBC: 7.6 10*3/uL (ref 4.0–10.5)

## 2016-12-26 LAB — COMPREHENSIVE METABOLIC PANEL
ALBUMIN: 4.8 g/dL (ref 3.5–5.2)
ALT: 21 U/L (ref 0–35)
AST: 22 U/L (ref 0–37)
Alkaline Phosphatase: 56 U/L (ref 39–117)
BILIRUBIN TOTAL: 1.1 mg/dL (ref 0.2–1.2)
BUN: 15 mg/dL (ref 6–23)
CALCIUM: 10.1 mg/dL (ref 8.4–10.5)
CO2: 31 mEq/L (ref 19–32)
CREATININE: 0.72 mg/dL (ref 0.40–1.20)
Chloride: 103 mEq/L (ref 96–112)
GFR: 88.41 mL/min (ref >60.00)
Glucose, Bld: 96 mg/dL (ref 70–99)
Potassium: 4.7 mEq/L (ref 3.5–5.1)
Sodium: 139 mEq/L (ref 135–145)
Total Protein: 7.6 g/dL (ref 6.0–8.3)

## 2016-12-26 LAB — LIPID PANEL
CHOL/HDL RATIO: 2
CHOLESTEROL: 151 mg/dL (ref 0–200)
HDL: 80.9 mg/dL (ref >39.00)
LDL Cholesterol: 61 mg/dL (ref 0–99)
NonHDL: 70.28
TRIGLYCERIDES: 46 mg/dL (ref 0.0–149.0)
VLDL: 9.2 mg/dL (ref 0.0–40.0)

## 2016-12-26 LAB — TSH: TSH: 1.42 u[IU]/mL (ref 0.35–4.50)

## 2016-12-26 NOTE — Progress Notes (Signed)
Subjective:  Patient ID: Cynthia Zuniga, female    DOB: 10-28-1960  Age: 56 y.o. MRN: [id number]  CC: Annual physical exam  HPI Cynthia Zuniga is a 56 y.o. female presents to the clinic today for an annual physical exam.  Preventative Healthcare  Pap smear: Up-to-date.  Mammogram: In need of.  Colonoscopy: Declines  Immunizations - Declines immunizations/vaccines.  Labs: Labs today.  Alcohol use: No.  Smoking/tobacco use: No.  PMH, Surgical Hx, Family Hx, Social History reviewed and updated as below.  Past Medical History:  Diagnosis Date  . Ankle pain, chronic 06/22/2015  . Hyperlipidemia 07/05/2016     Past Surgical History:  Procedure Laterality Date  . APPENDECTOMY  1988   Family History  Problem Relation Age of Onset  . Heart disease Mother   . Heart disease Father   . Stroke Paternal Grandfather   . Hypertension Paternal Grandfather   . Diabetes Paternal Grandfather    Social History  Substance Use Topics  . Smoking status: Never Smoker  . Smokeless tobacco: Never Used  . Alcohol use No    Review of Systems General: Denies unexplained weight loss, fever. Skin: Denies new or changing mole, sore/wound that won't heal. ENT: Trouble hearing, ringing in the ears, sores in the mouth, hoarseness, trouble swallowing. Eyes: Denies trouble seeing/visual disturbance. Heart/CV: Denies chest pain, shortness of breath, edema, palpitations. Lungs/Resp: Denies cough, shortness of breath, hemoptysis. Abd/GI: Denies nausea, vomiting, diarrhea, constipation, abdominal pain, hematochezia, melena. GU: Denies dysuria, incontinence, hematuria, urinary frequency, difficulty starting/keeping stream, vaginal discharge, sexual difficulty, lump in breasts. MSK: Denies joint pain/swelling, myalgias. Neuro: Denies headaches, weakness, numbness, dizziness, syncope. Psych: Denies sadness, anxiety, stress, memory difficulty. Endocrine: Denies polyuria and  polydipsia.  Objective:   Today's Vitals: BP 130/78 (BP Location: Right Arm, Patient Position: Sitting, Cuff Size: Small)   Pulse 72   Temp 98.2 F (36.8 C) (Oral)   Ht 5' (1.524 m)   Wt 106 lb 6 oz (48.3 kg)   SpO2 98%   BMI 20.77 kg/m   Physical Exam  Constitutional: She is oriented to person, place, and time. She appears well-developed and well-nourished. No distress.  HENT:  Head: Normocephalic and atraumatic.  Nose: Nose normal.  Mouth/Throat: Oropharynx is clear and moist. No oropharyngeal exudate.  Normal TM's bilaterally.   Eyes: Conjunctivae are normal. No scleral icterus.  Neck: Neck supple.  Cardiovascular: Normal rate and regular rhythm.   No murmur heard. Pulmonary/Chest: Effort normal and breath sounds normal. She has no wheezes. She has no rales.  Abdominal: Soft. She exhibits no distension. There is no tenderness. There is no rebound and no guarding.  Musculoskeletal: Normal range of motion. She exhibits no edema.  Lymphadenopathy:    She has no cervical adenopathy.  Neurological: She is alert and oriented to person, place, and time.  Skin: Skin is warm and dry. No rash noted.  Psychiatric: She has a normal mood and affect.  Vitals reviewed.  Assessment & Plan:   Problem List Items Addressed This Visit    Annual physical exam - Primary    Pap smear up-to-date. Mammogram ordered and scheduled. Declines colonoscopy. Declined immunizations. Labs today.      Relevant Orders   CBC   Comprehensive metabolic panel   Lipid panel   TSH    Other Visit Diagnoses    Screening mammogram, encounter for       Relevant Orders   MM DIAG BREAST TOMO BILATERAL     Follow-up: Annually  [place]

## 2016-12-26 NOTE — Assessment & Plan Note (Signed)
Pap smear up-to-date. Mammogram ordered and scheduled. Declines colonoscopy. Declined immunizations. Labs today.

## 2016-12-26 NOTE — Patient Instructions (Signed)
You're doing well.  Follow up annually.  Take care  Dr. Ciara Kagan  Health Maintenance, Female Adopting a healthy lifestyle and getting preventive care can go a long way to promote health and wellness. Talk with your health care provider about what schedule of regular examinations is right for you. This is a good chance for you to check in with your provider about disease prevention and staying healthy. In between checkups, there are plenty of things you can do on your own. Experts have done a lot of research about which lifestyle changes and preventive measures are most likely to keep you healthy. Ask your health care provider for more information. Weight and diet Eat a healthy diet  Be sure to include plenty of vegetables, fruits, low-fat dairy products, and lean protein.  Do not eat a lot of foods high in solid fats, added sugars, or salt.  Get regular exercise. This is one of the most important things you can do for your health.  Most adults should exercise for at least 150 minutes each week. The exercise should increase your heart rate and make you sweat (moderate-intensity exercise).  Most adults should also do strengthening exercises at least twice a week. This is in addition to the moderate-intensity exercise. Maintain a healthy weight  Body mass index (BMI) is a measurement that can be used to identify possible weight problems. It estimates body fat based on height and weight. Your health care provider can help determine your BMI and help you achieve or maintain a healthy weight.  For females 20 years of age and older:  A BMI below 18.5 is considered underweight.  A BMI of 18.5 to 24.9 is normal.  A BMI of 25 to 29.9 is considered overweight.  A BMI of 30 and above is considered obese. Watch levels of cholesterol and blood lipids  You should start having your blood tested for lipids and cholesterol at 58 years of age, then have this test every 5 years.  You may need to have  your cholesterol levels checked more often if:  Your lipid or cholesterol levels are high.  You are older than 58 years of age.  You are at high risk for heart disease. Cancer screening Lung Cancer  Lung cancer screening is recommended for adults 55-80 years old who are at high risk for lung cancer because of a history of smoking.  A yearly low-dose CT scan of the lungs is recommended for people who:  Currently smoke.  Have quit within the past 15 years.  Have at least a 30-pack-year history of smoking. A pack year is smoking an average of one pack of cigarettes a day for 1 year.  Yearly screening should continue until it has been 15 years since you quit.  Yearly screening should stop if you develop a health problem that would prevent you from having lung cancer treatment. Breast Cancer  Practice breast self-awareness. This means understanding how your breasts normally appear and feel.  It also means doing regular breast self-exams. Let your health care provider know about any changes, no matter how small.  If you are in your 20s or 30s, you should have a clinical breast exam (CBE) by a health care provider every 1-3 years as part of a regular health exam.  If you are 40 or older, have a CBE every year. Also consider having a breast X-ray (mammogram) every year.  If you have a family history of breast cancer, talk to your health care provider   about genetic screening.  If you are at high risk for breast cancer, talk to your health care provider about having an MRI and a mammogram every year.  Breast cancer gene (BRCA) assessment is recommended for women who have family members with BRCA-related cancers. BRCA-related cancers include:  Breast.  Ovarian.  Tubal.  Peritoneal cancers.  Results of the assessment will determine the need for genetic counseling and BRCA1 and BRCA2 testing. Cervical Cancer  Your health care provider may recommend that you be screened regularly  for cancer of the pelvic organs (ovaries, uterus, and vagina). This screening involves a pelvic examination, including checking for microscopic changes to the surface of your cervix (Pap test). You may be encouraged to have this screening done every 3 years, beginning at age 21.  For women ages 30-65, health care providers may recommend pelvic exams and Pap testing every 3 years, or they may recommend the Pap and pelvic exam, combined with testing for human papilloma virus (HPV), every 5 years. Some types of HPV increase your risk of cervical cancer. Testing for HPV may also be done on women of any age with unclear Pap test results.  Other health care providers may not recommend any screening for nonpregnant women who are considered low risk for pelvic cancer and who do not have symptoms. Ask your health care provider if a screening pelvic exam is right for you.  If you have had past treatment for cervical cancer or a condition that could lead to cancer, you need Pap tests and screening for cancer for at least 20 years after your treatment. If Pap tests have been discontinued, your risk factors (such as having a new sexual partner) need to be reassessed to determine if screening should resume. Some women have medical problems that increase the chance of getting cervical cancer. In these cases, your health care provider may recommend more frequent screening and Pap tests. Colorectal Cancer  This type of cancer can be detected and often prevented.  Routine colorectal cancer screening usually begins at 58 years of age and continues through 58 years of age.  Your health care provider may recommend screening at an earlier age if you have risk factors for colon cancer.  Your health care provider may also recommend using home test kits to check for hidden blood in the stool.  A small camera at the end of a tube can be used to examine your colon directly (sigmoidoscopy or colonoscopy). This is done to check  for the earliest forms of colorectal cancer.  Routine screening usually begins at age 50.  Direct examination of the colon should be repeated every 5-10 years through 58 years of age. However, you may need to be screened more often if early forms of precancerous polyps or small growths are found. Skin Cancer  Check your skin from head to toe regularly.  Tell your health care provider about any new moles or changes in moles, especially if there is a change in a mole's shape or color.  Also tell your health care provider if you have a mole that is larger than the size of a pencil eraser.  Always use sunscreen. Apply sunscreen liberally and repeatedly throughout the day.  Protect yourself by wearing long sleeves, pants, a wide-brimmed hat, and sunglasses whenever you are outside. Heart disease, diabetes, and high blood pressure  High blood pressure causes heart disease and increases the risk of stroke. High blood pressure is more likely to develop in:  People who have   blood pressure in the high end of the normal range (130-139/85-89 mm Hg).  People who are overweight or obese.  People who are African American.  If you are 45-75 years of age, have your blood pressure checked every 3-5 years. If you are 71 years of age or older, have your blood pressure checked every year. You should have your blood pressure measured twice-once when you are at a hospital or clinic, and once when you are not at a hospital or clinic. Record the average of the two measurements. To check your blood pressure when you are not at a hospital or clinic, you can use:  An automated blood pressure machine at a pharmacy.  A home blood pressure monitor.  If you are between 46 years and 59 years old, ask your health care provider if you should take aspirin to prevent strokes.  Have regular diabetes screenings. This involves taking a blood sample to check your fasting blood sugar level.  If you are at a normal weight  and have a low risk for diabetes, have this test once every three years after 58 years of age.  If you are overweight and have a high risk for diabetes, consider being tested at a younger age or more often. Preventing infection Hepatitis B  If you have a higher risk for hepatitis B, you should be screened for this virus. You are considered at high risk for hepatitis B if:  You were born in a country where hepatitis B is common. Ask your health care provider which countries are considered high risk.  Your parents were born in a high-risk country, and you have not been immunized against hepatitis B (hepatitis B vaccine).  You have HIV or AIDS.  You use needles to inject street drugs.  You live with someone who has hepatitis B.  You have had sex with someone who has hepatitis B.  You get hemodialysis treatment.  You take certain medicines for conditions, including cancer, organ transplantation, and autoimmune conditions. Hepatitis C  Blood testing is recommended for:  Everyone born from [birth year] through 1967.  Anyone with known risk factors for hepatitis C. Sexually transmitted infections (STIs)  You should be screened for sexually transmitted infections (STIs) including gonorrhea and chlamydia if:  You are sexually active and are younger than 58 years of age.  You are older than 58 years of age and your health care provider tells you that you are at risk for this type of infection.  Your sexual activity has changed since you were last screened and you are at an increased risk for chlamydia or gonorrhea. Ask your health care provider if you are at risk.  If you do not have HIV, but are at risk, it may be recommended that you take a prescription medicine daily to prevent HIV infection. This is called pre-exposure prophylaxis (PrEP). You are considered at risk if:  You are sexually active and do not regularly use condoms or know the HIV status of your partner(s).  You take drugs by  injection.  You are sexually active with a partner who has HIV. Talk with your health care provider about whether you are at high risk of being infected with HIV. If you choose to begin PrEP, you should first be tested for HIV. You should then be tested every 3 months for as long as you are taking PrEP. Pregnancy  If you are premenopausal and you may become pregnant, ask your health care provider about preconception counseling.  If you may become pregnant, take 400 to 800 micrograms (mcg) of folic acid every day.  If you want to prevent pregnancy, talk to your health care provider about birth control (contraception). Osteoporosis and menopause  Osteoporosis is a disease in which the bones lose minerals and strength with aging. This can result in serious bone fractures. Your risk for osteoporosis can be identified using a bone density scan.  If you are 37 years of age or older, or if you are at risk for osteoporosis and fractures, ask your health care provider if you should be screened.  Ask your health care provider whether you should take a calcium or vitamin D supplement to lower your risk for osteoporosis.  Menopause may have certain physical symptoms and risks.  Hormone replacement therapy may reduce some of these symptoms and risks. Talk to your health care provider about whether hormone replacement therapy is right for you. Follow these instructions at home:  Schedule regular health, dental, and eye exams.  Stay current with your immunizations.  Do not use any tobacco products including cigarettes, chewing tobacco, or electronic cigarettes.  If you are pregnant, do not drink alcohol.  If you are breastfeeding, limit how much and how often you drink alcohol.  Limit alcohol intake to no more than 1 drink per day for nonpregnant women. One drink equals 12 ounces of beer, 5 ounces of wine, or 1 ounces of hard liquor.  Do not use street drugs.  Do not share needles.  Ask  your health care provider for help if you need support or information about quitting drugs.  Tell your health care provider if you often feel depressed.  Tell your health care provider if you have ever been abused or do not feel safe at home. This information is not intended to replace advice given to you by your health care provider. Make sure you discuss any questions you have with your health care provider. Document Released: 02/04/2011 Document Revised: 12/28/2015 Document Reviewed: 04/25/2015 Elsevier Interactive Patient Education  2017 [employer].

## 2016-12-31 ENCOUNTER — Encounter: Payer: Self-pay | Admitting: *Deleted

## 2017-01-10 ENCOUNTER — Ambulatory Visit
Admission: RE | Admit: 2017-01-10 | Discharge: 2017-01-10 | Disposition: A | Discharge status: Home / Self Care | Payer: 59 | Source: Ambulatory Visit | Attending: Family Medicine | Admitting: Family Medicine

## 2017-01-10 ENCOUNTER — Telehealth: Payer: Self-pay | Admitting: Family Medicine

## 2017-01-10 ENCOUNTER — Other Ambulatory Visit: Payer: Self-pay | Admitting: Family Medicine

## 2017-01-10 DIAGNOSIS — Z1231 Encounter for screening mammogram for malignant neoplasm of breast: Secondary | ICD-10-CM | POA: Insufficient documentation

## 2017-01-10 MED ORDER — ROSUVASTATIN CALCIUM 10 MG PO TABS: 10.0000 mg | ORAL_TABLET | Freq: Every day | ORAL | 3 refills | Status: DC

## 2017-01-10 NOTE — Telephone Encounter (Signed)
Pt called requesting a refill on pt's rosuvastatin (CRESTOR) 10 MG tablet. Please advise, thank you!  Pharmacy - [facility] [place] - [address]

## 2017-05-16 DIAGNOSIS — H5203 Hypermetropia, bilateral: Secondary | ICD-10-CM | POA: Diagnosis not present

## 2017-05-16 DIAGNOSIS — H524 Presbyopia: Secondary | ICD-10-CM | POA: Diagnosis not present

## 2017-05-16 DIAGNOSIS — H52223 Regular astigmatism, bilateral: Secondary | ICD-10-CM | POA: Diagnosis not present

## 2017-11-13 ENCOUNTER — Ambulatory Visit: Payer: 59 | Admitting: Internal Medicine

## 2017-11-13 ENCOUNTER — Encounter: Payer: Self-pay | Admitting: Internal Medicine

## 2017-11-13 VITALS — BP 92/64 | HR 86 | Temp 98.0°F | Ht 60.0 in | Wt 107.2 lb

## 2017-11-13 DIAGNOSIS — Z1389 Encounter for screening for other disorder: Secondary | ICD-10-CM | POA: Diagnosis not present

## 2017-11-13 DIAGNOSIS — E785 Hyperlipidemia, unspecified: Secondary | ICD-10-CM | POA: Diagnosis not present

## 2017-11-13 DIAGNOSIS — Z13818 Encounter for screening for other digestive system disorders: Secondary | ICD-10-CM | POA: Diagnosis not present

## 2017-11-13 DIAGNOSIS — E559 Vitamin D deficiency, unspecified: Secondary | ICD-10-CM

## 2017-11-13 DIAGNOSIS — Z0000 Encounter for general adult medical examination without abnormal findings: Secondary | ICD-10-CM

## 2017-11-13 DIAGNOSIS — Z1231 Encounter for screening mammogram for malignant neoplasm of breast: Secondary | ICD-10-CM

## 2017-11-13 DIAGNOSIS — D696 Thrombocytopenia, unspecified: Secondary | ICD-10-CM | POA: Diagnosis not present

## 2017-11-13 DIAGNOSIS — Z1159 Encounter for screening for other viral diseases: Secondary | ICD-10-CM

## 2017-11-13 DIAGNOSIS — Z1329 Encounter for screening for other suspected endocrine disorder: Secondary | ICD-10-CM | POA: Diagnosis not present

## 2017-11-13 MED ORDER — ROSUVASTATIN CALCIUM 10 MG PO TABS: 10.0000 mg | ORAL_TABLET | Freq: Every day | ORAL | 3 refills | Status: DC

## 2017-11-13 NOTE — Patient Instructions (Addendum)
[facility] and if they check Hep B titer surface antibody  Please get back with me  sch labs 12/26/17  sch mammogram 01/10/18  Follow up with me in 3-4 months  Pap due 06/21/18 or after   Cholesterol Cholesterol is a white, waxy, fat-like substance that is needed by the human body in small amounts. The liver makes all the cholesterol we need. Cholesterol is carried from the liver by the blood through the blood vessels. Deposits of cholesterol (plaques) may build up on blood vessel (artery) walls. Plaques make the arteries narrower and stiffer. Cholesterol plaques increase the risk for heart attack and stroke. You cannot feel your cholesterol level even if it is very high. The only way to know that it is high is to have a blood test. Once you know your cholesterol levels, you should keep a record of the test results. Work with your health care provider to keep your levels in the desired range. What do the results mean?  Total cholesterol is a rough measure of all the cholesterol in your blood.  LDL (low-density lipoprotein) is the "bad" cholesterol. This is the type that causes plaque to build up on the artery walls. You want this level to be low.  HDL (high-density lipoprotein) is the "good" cholesterol because it cleans the arteries and carries the LDL away. You want this level to be high.  Triglycerides are fat that the body can either burn for energy or store. High levels are closely linked to heart disease. What are the desired levels of cholesterol?  Total cholesterol below 200.  LDL below 100 for people who are at risk, below 70 for people at very high risk.  HDL above 40 is good. A level of 60 or higher is considered to be protective against heart disease.  Triglycerides below 150. How can I lower my cholesterol? Diet Follow your diet program as told by your health care provider.  Choose fish or white meat chicken and [place], roasted or baked.  Limit fatty cuts of red meat, fried foods, and processed meats, such as sausage and lunch meats.  Eat lots of fresh fruits and vegetables.  Choose whole grains, beans, pasta, potatoes, and cereals.  Choose olive oil, corn oil, or canola oil, and use only small amounts.  Avoid butter, mayonnaise, shortening, or palm kernel oils.  Avoid foods with trans fats.  Drink skim or nonfat milk and eat low-fat or nonfat yogurt and cheeses. Avoid whole milk, cream, ice cream, egg yolks, and full-fat cheeses.  Healthier desserts include angel food cake, ginger snaps, animal crackers, hard candy, popsicles, and low-fat or nonfat frozen yogurt. Avoid pastries, cakes, pies, and cookies.  Exercise  Follow your exercise program as told by your health care provider. A regular program: ? Helps to decrease LDL and raise HDL. ? Helps with weight control.  Do things that increase your activity level, such as gardening, walking, and taking the stairs.  Ask your health care provider about ways that you can be more active in your daily life.  Medicine  Take over-the-counter and prescription medicines only as told by your health care provider. ? Medicine may be prescribed by your health care provider to help lower cholesterol and decrease the risk for heart disease. This is usually done if diet and exercise have failed to bring down cholesterol levels. ? If you have several risk factors, you may need medicine even if your levels are normal.  This  information is not intended to replace advice given to you by your health care provider. Make sure you discuss any questions you have with your health care provider. Document Released: 04/16/2001 Document Revised: 02/17/2016 Document Reviewed: 01/20/2016 Elsevier Interactive Patient Education  [employer].

## 2017-11-13 NOTE — Progress Notes (Signed)
Pre visit review using our clinic review tool, if applicable. No additional management support is needed unless otherwise documented below in the visit note. 

## 2017-11-16 ENCOUNTER — Encounter: Payer: Self-pay | Admitting: Internal Medicine

## 2017-11-16 DIAGNOSIS — D696 Thrombocytopenia, unspecified: Secondary | ICD-10-CM | POA: Insufficient documentation

## 2017-11-16 NOTE — Progress Notes (Signed)
Chief Complaint  Patient presents with  . Follow-up    transfer from Dr. Adriana Simasook   Follow up doing well no complaints   1. HLD on Crestor 10 mg she wants to know if can stop or reduce dose   Review of Systems  Constitutional: Negative for weight loss.  HENT: Negative for hearing loss.   Eyes: Negative for blurred vision.  Respiratory: Negative for shortness of breath.   Cardiovascular: Negative for chest pain.  Gastrointestinal: Negative for abdominal pain.  Musculoskeletal: Negative for falls.  Skin: Negative for rash.  Neurological: Negative for headaches.  Psychiatric/Behavioral: Negative for depression.   Past Medical History:  Diagnosis Date  . Ankle pain, chronic 06/22/2015  . Hyperlipidemia 07/05/2016   Past Surgical History:  Procedure Laterality Date  . APPENDECTOMY  1988   Family History  Problem Relation Age of Onset  . Heart disease Mother   . Heart disease Father   . Stroke Paternal Grandfather   . Hypertension Paternal Grandfather   . Diabetes Paternal Grandfather    Social History   Socioeconomic History  . Marital status: Married    Spouse name: Not on file  . Number of children: Not on file  . Years of education: Not on file  . Highest education level: Not on file  Occupational History  . Not on file  Social Needs  . Financial resource strain: Not on file  . Food insecurity:    Worry: Not on file    Inability: Not on file  . Transportation needs:    Medical: Not on file    Non-medical: Not on file  Tobacco Use  . Smoking status: Never Smoker  . Smokeless tobacco: Never Used  Substance and Sexual Activity  . Alcohol use: No  . Drug use: No  . Sexual activity: Not on file  Lifestyle  . Physical activity:    Days per week: Not on file    Minutes per session: Not on file  . Stress: Not on file  Relationships  . Social connections:    Talks on phone: Not on file    Gets together: Not on file    Attends religious service: Not on file   Active member of club or organization: Not on file    Attends meetings of clubs or organizations: Not on file    Relationship status: Not on file  . Intimate partner violence:    Fear of current or ex partner: Not on file    Emotionally abused: Not on file    Physically abused: Not on file    Forced sexual activity: Not on file  Other Topics Concern  . Not on file  Social History Narrative   Married    Kids    From [place] family still there    Current Meds  Medication Sig  . rosuvastatin (CRESTOR) 10 MG tablet Take 1 tablet (10 mg total) by mouth daily. At night  . [DISCONTINUED] meclizine (ANTIVERT) 25 MG tablet Take 1 tablet (25 mg total) by mouth 3 (three) times daily as needed for dizziness.  . [DISCONTINUED] rosuvastatin (CRESTOR) 10 MG tablet Take 1 tablet (10 mg total) by mouth daily.   No Known Allergies No results found for this or any previous visit (from the past 2160 hour(s)). Objective  Body mass index is 32.65 kg/m. Wt Readings from Last 3 Encounters:  11/13/17 167 lb 3.2 oz (75.8 kg)  12/26/16 106 lb 6 oz (48.3 kg)  07/05/16 108 lb 2 oz (  49 kg)   Temp Readings from Last 3 Encounters:  11/13/17 98 F (36.7 C) (Oral)  12/26/16 98.2 F (36.8 C) (Oral)  07/05/16 98 F (36.7 C) (Oral)   BP Readings from Last 3 Encounters:  11/13/17 92/64  12/26/16 130/78  07/05/16 122/72   Pulse Readings from Last 3 Encounters:  11/13/17 86  12/26/16 72  07/05/16 91    Physical Exam  Constitutional: She is oriented to person, place, and time. Vital signs are normal. She appears well-developed and well-nourished.  HENT:  Head: Normocephalic and atraumatic.  Mouth/Throat: Oropharynx is clear and moist and mucous membranes are normal.  Eyes: Pupils are equal, round, and reactive to light. Conjunctivae are normal.  Cardiovascular: Normal rate, regular rhythm and normal heart sounds.  Pulmonary/Chest: Effort normal and breath sounds normal.  Neurological: She is alert and  oriented to person, place, and time. Gait normal.  Skin: Skin is warm, dry and intact.  Psychiatric: She has a normal mood and affect. Her speech is normal and behavior is normal. Judgment and thought content normal. Cognition and memory are normal.  Nursing note and vitals reviewed.   Assessment   1. HLD 2. HM Plan  1. sch fasting labs CMET, CBC, lipid, UA, TSH, T4, vit D, hep C  Cont crestor 10 mg qd consider reduce dose to 5 mg in future  Will mail cholesterol handout  2.  Had flu shot  Tdap and hep B titer pt to check at employee health [employer] dates  Check hep C  Labs due 12/26/17 mammo due 01/10/18 referred today  Given info on cologaurd pt to call and check price with insurance never had colonoscopy  Pap had 06/22/15 neg pap neg HPV due 06/2018   Physical at f/u  Provider: Dr. French Ana McLean-Scocuzza-Internal Medicine

## 2018-03-04 ENCOUNTER — Other Ambulatory Visit: Payer: Self-pay

## 2018-03-04 ENCOUNTER — Other Ambulatory Visit (INDEPENDENT_AMBULATORY_CARE_PROVIDER_SITE_OTHER): Payer: 59

## 2018-03-04 ENCOUNTER — Telehealth: Payer: Self-pay | Admitting: Internal Medicine

## 2018-03-04 DIAGNOSIS — Z13818 Encounter for screening for other digestive system disorders: Secondary | ICD-10-CM

## 2018-03-04 DIAGNOSIS — Z1159 Encounter for screening for other viral diseases: Secondary | ICD-10-CM

## 2018-03-04 DIAGNOSIS — E785 Hyperlipidemia, unspecified: Secondary | ICD-10-CM

## 2018-03-04 MED ORDER — ROSUVASTATIN CALCIUM 10 MG PO TABS
10.0000 mg | ORAL_TABLET | Freq: Every day | ORAL | 3 refills | Status: DC
Start: 2018-03-04 — End: 2019-04-01

## 2018-03-04 NOTE — Telephone Encounter (Signed)
rx refill for crestor sent to patients pharmacy [facility]( [facility])

## 2018-03-04 NOTE — Telephone Encounter (Signed)
Pt needs a refill on rosuvastatin (CRESTOR) 10 MG tablet [facility]

## 2018-03-06 ENCOUNTER — Other Ambulatory Visit: Payer: Self-pay | Admitting: Internal Medicine

## 2018-03-06 DIAGNOSIS — Z0000 Encounter for general adult medical examination without abnormal findings: Secondary | ICD-10-CM

## 2018-03-06 DIAGNOSIS — Z1329 Encounter for screening for other suspected endocrine disorder: Secondary | ICD-10-CM

## 2018-03-06 DIAGNOSIS — D696 Thrombocytopenia, unspecified: Secondary | ICD-10-CM

## 2018-03-06 DIAGNOSIS — E559 Vitamin D deficiency, unspecified: Secondary | ICD-10-CM

## 2018-03-06 DIAGNOSIS — Z1389 Encounter for screening for other disorder: Secondary | ICD-10-CM

## 2018-03-06 DIAGNOSIS — E785 Hyperlipidemia, unspecified: Secondary | ICD-10-CM

## 2018-03-06 DIAGNOSIS — Z13220 Encounter for screening for lipoid disorders: Secondary | ICD-10-CM

## 2018-03-07 LAB — TEST AUTHORIZATION

## 2018-03-07 LAB — LIPID PANEL

## 2018-03-07 LAB — HEPATITIS C ANTIBODY
Hepatitis C Ab: NONREACTIVE
SIGNAL TO CUT-OFF: 0.03 (ref <1.00)

## 2018-03-07 LAB — T4, FREE

## 2018-03-07 LAB — TSH

## 2018-03-07 LAB — VITAMIN D 25 HYDROXY (VIT D DEFICIENCY, FRACTURES)

## 2018-03-09 ENCOUNTER — Telehealth: Payer: Self-pay

## 2018-03-09 ENCOUNTER — Other Ambulatory Visit: Payer: Self-pay | Admitting: Internal Medicine

## 2018-03-09 ENCOUNTER — Telehealth: Payer: Self-pay | Admitting: Internal Medicine

## 2018-03-09 DIAGNOSIS — Z1329 Encounter for screening for other suspected endocrine disorder: Secondary | ICD-10-CM

## 2018-03-09 DIAGNOSIS — Z1389 Encounter for screening for other disorder: Secondary | ICD-10-CM

## 2018-03-09 DIAGNOSIS — Z0000 Encounter for general adult medical examination without abnormal findings: Secondary | ICD-10-CM

## 2018-03-09 DIAGNOSIS — D696 Thrombocytopenia, unspecified: Secondary | ICD-10-CM

## 2018-03-09 DIAGNOSIS — E785 Hyperlipidemia, unspecified: Secondary | ICD-10-CM

## 2018-03-09 DIAGNOSIS — E559 Vitamin D deficiency, unspecified: Secondary | ICD-10-CM

## 2018-03-09 NOTE — Telephone Encounter (Signed)
Pt's labs lapsed or expired and needs to come back in for fasting labs only lab resulted was hep C and negative  Please sch lab appt again

## 2018-03-09 NOTE — Telephone Encounter (Signed)
Copied from CRM [id number]. Topic: General - Other >> Mar 09, 2018 10:13 AM Tamela OddiHarris, Brenda J wrote: Reason for CRM: Patient called to request that her lab results be mailed to her home.  Please advise.  CB# [phone]. Lab results have been mailed to patient.

## 2018-03-10 NOTE — Telephone Encounter (Signed)
Left message for patient to return call back. PEC may give information and schedule lab appointment. Patient needs to give urine sample and CBC.

## 2018-03-10 NOTE — Telephone Encounter (Signed)
-----   Message from Penne LashBrittany N Wiggins, RT sent at 03/09/2018  8:16 AM EDT -----   ----- Message ----- From: McLean-Scocuzza, Pasty Spillersracy N, MD Sent: 03/06/2018   6:40 PM To: Warden FillersLatoya S Wright, CMA, Penne LashBrittany N Wiggins, RT  Please call pt urine not collected and need to do CBC sch lab visit  Labs expired and we did not collect all labs Dr. Lonie PeakMS wanted  No charge for lab for this visit please   TMS ----- Message ----- From: Penne LashWiggins, Brittany N, RT Sent: 03/05/2018  12:36 PM To: Warden FillersLatoya S Wright, CMA, #  Correct she had more labs but they were expired and once they have expired lab staff cant see them. She will have to come back for other labs.   ----- Message ----- From: McLean-Scocuzza, Pasty Spillersracy N, MD Sent: 03/05/2018  12:13 PM To: Warden FillersLatoya S Wright, CMA, Penne LashBrittany N Wiggins, RT  Pt should have had more comprehensive labs than hep C did she? If not she will have to come back  I think she let labs expire    TMS

## 2018-03-11 NOTE — Telephone Encounter (Signed)
Scheduled patient lab appointment for 03/16/18.

## 2018-03-13 ENCOUNTER — Ambulatory Visit
Admission: RE | Admit: 2018-03-13 | Discharge: 2018-03-13 | Disposition: A | Discharge status: Home / Self Care | Payer: 59 | Source: Ambulatory Visit | Attending: Internal Medicine | Admitting: Internal Medicine

## 2018-03-13 DIAGNOSIS — Z1231 Encounter for screening mammogram for malignant neoplasm of breast: Secondary | ICD-10-CM | POA: Diagnosis not present

## 2018-03-16 ENCOUNTER — Other Ambulatory Visit (INDEPENDENT_AMBULATORY_CARE_PROVIDER_SITE_OTHER): Payer: 59

## 2018-03-16 DIAGNOSIS — Z0000 Encounter for general adult medical examination without abnormal findings: Secondary | ICD-10-CM

## 2018-03-16 DIAGNOSIS — D696 Thrombocytopenia, unspecified: Secondary | ICD-10-CM | POA: Diagnosis not present

## 2018-03-16 DIAGNOSIS — Z1389 Encounter for screening for other disorder: Secondary | ICD-10-CM

## 2018-03-16 DIAGNOSIS — Z1329 Encounter for screening for other suspected endocrine disorder: Secondary | ICD-10-CM | POA: Diagnosis not present

## 2018-03-16 DIAGNOSIS — E785 Hyperlipidemia, unspecified: Secondary | ICD-10-CM

## 2018-03-16 DIAGNOSIS — E559 Vitamin D deficiency, unspecified: Secondary | ICD-10-CM | POA: Diagnosis not present

## 2018-03-16 LAB — URINALYSIS, ROUTINE W REFLEX MICROSCOPIC
Bilirubin Urine: NEGATIVE
Hgb urine dipstick: NEGATIVE
Ketones, ur: NEGATIVE
Leukocytes, UA: NEGATIVE
Nitrite: NEGATIVE
RBC / HPF: NONE SEEN (ref 0–?)
Total Protein, Urine: NEGATIVE
Urine Glucose: NEGATIVE
Urobilinogen, UA: 0.2 (ref 0.0–1.0)
WBC UA: NONE SEEN (ref 0–?)
pH: 7.5 (ref 5.0–8.0)

## 2018-03-16 LAB — COMPREHENSIVE METABOLIC PANEL
ALK PHOS: 61 U/L (ref 39–117)
ALT: 17 U/L (ref 0–35)
AST: 23 U/L (ref 0–37)
Albumin: 4.6 g/dL (ref 3.5–5.2)
BILIRUBIN TOTAL: 1.3 mg/dL — AB (ref 0.2–1.2)
BUN: 7 mg/dL (ref 6–23)
CO2: 30 mEq/L (ref 19–32)
CREATININE: 0.75 mg/dL (ref 0.40–1.20)
Calcium: 10 mg/dL (ref 8.4–10.5)
Chloride: 102 mEq/L (ref 96–112)
GFR: 83.99 mL/min (ref >60.00)
GLUCOSE: 113 mg/dL — AB (ref 70–99)
Potassium: 4.6 mEq/L (ref 3.5–5.1)
SODIUM: 137 meq/L (ref 135–145)
Total Protein: 7.8 g/dL (ref 6.0–8.3)

## 2018-03-16 LAB — COMPLETE METABOLIC PANEL WITH GFR
AG RATIO: 1.6 (calc) (ref 1.0–2.5)
ALT: 17 U/L (ref 6–29)
AST: 23 U/L (ref 10–35)
Albumin: 4.6 g/dL (ref 3.6–5.1)
Alkaline phosphatase (APISO): 65 U/L (ref 33–130)
BILIRUBIN TOTAL: 1.2 mg/dL (ref 0.2–1.2)
BUN: 7 mg/dL (ref 7–25)
CHLORIDE: 103 mmol/L (ref 98–110)
CO2: 28 mmol/L (ref 20–32)
Calcium: 9.7 mg/dL (ref 8.6–10.4)
Creat: 0.66 mg/dL (ref 0.50–1.05)
GFR, Est African American: 112 mL/min/{1.73_m2} (ref >=60)
GFR, Est Non African American: 97 mL/min/{1.73_m2} (ref >=60)
Globulin: 2.8 g/dL (calc) (ref 1.9–3.7)
Glucose, Bld: 115 mg/dL — ABNORMAL HIGH (ref 65–99)
POTASSIUM: 5 mmol/L (ref 3.5–5.3)
SODIUM: 138 mmol/L (ref 135–146)
Total Protein: 7.4 g/dL (ref 6.1–8.1)

## 2018-03-16 LAB — CBC WITH DIFFERENTIAL/PLATELET
BASOS PCT: 0.6 % (ref 0.0–3.0)
Basophils Absolute: 0 10*3/uL (ref 0.0–0.1)
EOS ABS: 0.1 10*3/uL (ref 0.0–0.7)
Eosinophils Relative: 1.3 % (ref 0.0–5.0)
HEMATOCRIT: 36.8 % (ref 36.0–46.0)
Hemoglobin: 12.8 g/dL (ref 12.0–15.0)
LYMPHS PCT: 30.6 % (ref 12.0–46.0)
Lymphs Abs: 1.8 10*3/uL (ref 0.7–4.0)
MCHC: 34.8 g/dL (ref 30.0–36.0)
MCV: 88.6 fl (ref 78.0–100.0)
Monocytes Absolute: 0.3 10*3/uL (ref 0.1–1.0)
Monocytes Relative: 4.7 % (ref 3.0–12.0)
Neutro Abs: 3.7 10*3/uL (ref 1.4–7.7)
Neutrophils Relative %: 62.8 % (ref 43.0–77.0)
Platelets: 157 10*3/uL (ref 150.0–400.0)
RBC: 4.15 Mil/uL (ref 3.87–5.11)
RDW: 13.3 % (ref 11.5–15.5)
WBC: 5.9 10*3/uL (ref 4.0–10.5)

## 2018-03-16 LAB — LIPID PANEL
CHOLESTEROL: 152 mg/dL (ref 0–200)
HDL: 75.5 mg/dL (ref >39.00)
LDL CALC: 63 mg/dL (ref 0–99)
NonHDL: 76.25
Total CHOL/HDL Ratio: 2
Triglycerides: 66 mg/dL (ref 0.0–149.0)
VLDL: 13.2 mg/dL (ref 0.0–40.0)

## 2018-03-16 LAB — VITAMIN D 25 HYDROXY (VIT D DEFICIENCY, FRACTURES): VITD: 21.52 ng/mL — AB (ref 30.00–100.00)

## 2018-03-16 LAB — T4, FREE: Free T4: 0.92 ng/dL (ref 0.60–1.60)

## 2018-03-16 LAB — TSH: TSH: 1.39 u[IU]/mL (ref 0.35–4.50)

## 2018-03-20 ENCOUNTER — Telehealth: Payer: Self-pay

## 2018-03-20 NOTE — Telephone Encounter (Signed)
Informed patient that results have yet to be seen by a provider for interpretation.

## 2018-03-20 NOTE — Telephone Encounter (Signed)
Copied from CRM [id number]. Topic: Quick Communication - Lab Results >> Mar 20, 2018  3:03 PM Cynthia AmatoBurton, Donna F wrote: Pt is looking for her lab results   Best number [phone]

## 2018-03-23 ENCOUNTER — Ambulatory Visit: Payer: 59 | Admitting: Family Medicine

## 2018-03-31 ENCOUNTER — Other Ambulatory Visit: Payer: Self-pay | Admitting: Internal Medicine

## 2018-03-31 DIAGNOSIS — E559 Vitamin D deficiency, unspecified: Secondary | ICD-10-CM | POA: Insufficient documentation

## 2018-03-31 MED ORDER — CHOLECALCIFEROL 1.25 MG (50000 UT) PO CAPS: 50000.0000 [IU] | ORAL_CAPSULE | ORAL | 1 refills | Status: DC

## 2018-04-14 ENCOUNTER — Telehealth: Payer: Self-pay

## 2018-04-14 NOTE — Telephone Encounter (Signed)
Copied from CRM [id number]. Topic: Inquiry >> Apr 14, 2018  3:43 PM Maia Petties wrote: Reason for CRM: Pt called requesting labs from 03/16/18 be mailed to home address. Verified address. [id number]  [place] [address] Labs have been mailed to address listed above.

## 2018-07-13 DIAGNOSIS — H40013 Open angle with borderline findings, low risk, bilateral: Secondary | ICD-10-CM | POA: Diagnosis not present

## 2018-07-13 DIAGNOSIS — H40003 Preglaucoma, unspecified, bilateral: Secondary | ICD-10-CM | POA: Diagnosis not present

## 2018-07-13 DIAGNOSIS — H25813 Combined forms of age-related cataract, bilateral: Secondary | ICD-10-CM | POA: Diagnosis not present

## 2018-07-13 DIAGNOSIS — H524 Presbyopia: Secondary | ICD-10-CM | POA: Diagnosis not present

## 2018-11-05 ENCOUNTER — Encounter: Payer: 59 | Admitting: Internal Medicine

## 2018-12-31 ENCOUNTER — Telehealth: Payer: Self-pay

## 2018-12-31 ENCOUNTER — Encounter: Payer: Self-pay | Admitting: Internal Medicine

## 2018-12-31 NOTE — Telephone Encounter (Signed)
Ive already sent a letter to her my chart   TMS

## 2018-12-31 NOTE — Telephone Encounter (Signed)
Copied from CRM [id number]. Topic: General - Other >> Dec 31, 2018 12:35 PM Jaquita Rector A wrote: Reason for CRM: Patient husband called to say that they will be setting up My Chart and would like the letter for her employer to also be sent to the My Chart also.

## 2019-01-04 ENCOUNTER — Other Ambulatory Visit: Payer: Self-pay | Admitting: Internal Medicine

## 2019-01-04 ENCOUNTER — Telehealth: Payer: Self-pay

## 2019-01-04 DIAGNOSIS — Z1329 Encounter for screening for other suspected endocrine disorder: Secondary | ICD-10-CM

## 2019-01-04 DIAGNOSIS — Z1389 Encounter for screening for other disorder: Secondary | ICD-10-CM

## 2019-01-04 DIAGNOSIS — Z1231 Encounter for screening mammogram for malignant neoplasm of breast: Secondary | ICD-10-CM

## 2019-01-04 DIAGNOSIS — E785 Hyperlipidemia, unspecified: Secondary | ICD-10-CM

## 2019-01-04 DIAGNOSIS — R739 Hyperglycemia, unspecified: Secondary | ICD-10-CM

## 2019-01-04 DIAGNOSIS — E559 Vitamin D deficiency, unspecified: Secondary | ICD-10-CM

## 2019-01-04 DIAGNOSIS — Z0000 Encounter for general adult medical examination without abnormal findings: Secondary | ICD-10-CM

## 2019-01-04 NOTE — Telephone Encounter (Signed)
Her lab appt needs to be moved back to 7/20 or after with f/u also moved back 3 days to 1 week after labs  Fasting labs  Cancel appts in June and resch lab visit and physical with pap to 7/20 or after but before 04/06/2019   This is due to husbands recent COVID 19 diagnosis

## 2019-01-04 NOTE — Telephone Encounter (Signed)
Patient needs fasting lab orders placed before PE appointment. Please advise.

## 2019-01-05 NOTE — Telephone Encounter (Signed)
Left message for patient to return call back. PEC may give and obtain information. Also sent My chart stated below.   Dr French Ana wanted to let you know that the lab appt needs to be moved back to 7/20 or after with Follow Up also moved back 3 days to 1 week after labs   Please call and Cancel appointments in June and reschedule lab visit and physical with pap to 7/20 or after but before 04/06/2019    This is due to husbands recent COVID 19 diagnosis

## 2019-01-05 NOTE — Telephone Encounter (Signed)
Pt called but was unable to connect with office. Please advise. Call back requested

## 2019-01-20 NOTE — Telephone Encounter (Signed)
Pt wanted a call back on why she needs to reschedule her appt  Wanted to speak to [facility]

## 2019-01-21 ENCOUNTER — Encounter: Payer: 59 | Admitting: Internal Medicine

## 2019-01-21 ENCOUNTER — Encounter: Payer: Self-pay | Admitting: Internal Medicine

## 2019-01-25 ENCOUNTER — Other Ambulatory Visit: Payer: 59

## 2019-01-29 ENCOUNTER — Encounter: Payer: 59 | Admitting: Internal Medicine

## 2019-03-05 ENCOUNTER — Encounter: Payer: 59 | Admitting: Internal Medicine

## 2019-03-23 ENCOUNTER — Ambulatory Visit: Payer: 59 | Admitting: Family Medicine

## 2019-03-23 ENCOUNTER — Ambulatory Visit (INDEPENDENT_AMBULATORY_CARE_PROVIDER_SITE_OTHER)
Admission: RE | Admit: 2019-03-23 | Discharge: 2019-03-23 | Disposition: A | Discharge status: Home / Self Care | Payer: 59 | Source: Ambulatory Visit | Attending: Family Medicine | Admitting: Family Medicine

## 2019-03-23 ENCOUNTER — Telehealth: Payer: Self-pay | Admitting: Internal Medicine

## 2019-03-23 ENCOUNTER — Other Ambulatory Visit: Payer: Self-pay

## 2019-03-23 ENCOUNTER — Encounter: Payer: Self-pay | Admitting: Family Medicine

## 2019-03-23 VITALS — BP 100/70 | HR 83 | Temp 97.9°F | Ht 62.0 in | Wt 107.6 lb

## 2019-03-23 DIAGNOSIS — M533 Sacrococcygeal disorders, not elsewhere classified: Secondary | ICD-10-CM | POA: Diagnosis not present

## 2019-03-23 DIAGNOSIS — M545 Low back pain, unspecified: Secondary | ICD-10-CM

## 2019-03-23 DIAGNOSIS — S3992XA Unspecified injury of lower back, initial encounter: Secondary | ICD-10-CM | POA: Diagnosis not present

## 2019-03-23 LAB — POCT URINE PREGNANCY: Preg Test, Ur: NEGATIVE

## 2019-03-23 NOTE — Progress Notes (Signed)
Tommi Rumps, MD Phone: [phone]  Cynthia Zuniga is a 58 y.o. female who presents today for same-day visit.  Low back pain: Patient reports 4 days ago she was on a stepladder and fell from several feet up when reaching for something onto her buttocks and low back.  Since then she has had pain in this area.  She notes no radiation of the pain.  No numbness, weakness, incontinence, or saddle anesthesia.  No fevers.  She notes it hurts to get up and bend.  Motrin has been helpful.  No known history of osteoporosis.  Social History   Tobacco Use  Smoking Status Never Smoker  Smokeless Tobacco Never Used     ROS see history of present illness  Objective  Physical Exam Vitals:   03/23/19 1139  BP: 100/70  Pulse: 83  Temp: 97.9 F (36.6 C)  SpO2: 99%    BP Readings from Last 3 Encounters:  03/23/19 100/70  11/13/17 92/64  12/26/16 130/78   Wt Readings from Last 3 Encounters:  03/23/19 107 lb 9.6 oz (48.8 kg)  11/13/17 107 lb 3.2 oz (48.6 kg)  12/26/16 106 lb 6 oz (48.3 kg)    Physical Exam Constitutional:      General: She is not in acute distress.    Appearance: She is not diaphoretic.  Cardiovascular:     Rate and Rhythm: Normal rate and regular rhythm.     Heart sounds: Normal heart sounds.  Pulmonary:     Effort: Pulmonary effort is normal.     Breath sounds: Normal breath sounds.  Musculoskeletal:     Comments: No midline spine tenderness, no midline spine step-off, there is lower lumbar muscular back tenderness and tenderness over her sacrum with no obvious skin changes  Skin:    General: Skin is warm and dry.  Neurological:     Mental Status: She is alert.     Comments: 5/5 strength bilateral quads, hamstrings, plantar flexion, and dorsiflexion, sensation light touch intact bilateral lower extremities, 2+ patellar reflexes      Assessment/Plan: Please see individual problem list.  Acute bilateral low back pain without sciatica Status post  fall.  Given pain in her low back and sacrum in the height of her fall we will obtain imaging to rule out underlying fracture.  She can continue Motrin.  Given return precautions. Work note provided to be out through the weekend.    Orders Placed This Encounter  Procedures  . DG Lumbar Spine Complete    Standing Status:   Future    Standing Expiration Date:   05/22/2020    Order Specific Question:   Reason for Exam (SYMPTOM  OR DIAGNOSIS REQUIRED)    Answer:   low back pain after a fall from several feet up    Order Specific Question:   Is patient pregnant?    Answer:   No    Order Specific Question:   Preferred imaging location?    Answer:   [facility]    Order Specific Question:   Radiology Contrast Protocol - do NOT remove file path    Answer:   \\[email or web address]\Radiant\DXFluoroContrastProtocols.pdf  . DG Sacrum/Coccyx    Standing Status:   Future    Standing Expiration Date:   05/22/2020    Order Specific Question:   Reason for Exam (SYMPTOM  OR DIAGNOSIS REQUIRED)    Answer:   sacrum pain after a fall from several feet    Order Specific Question:   Is patient pregnant?  Answer:   No    Order Specific Question:   Preferred imaging location?    Answer:   [facility]    Order Specific Question:   Radiology Contrast Protocol - do NOT remove file path    Answer:   \\[email or web address]\Radiant\DXFluoroContrastProtocols.pdf  . POCT urine pregnancy    No orders of the defined types were placed in this encounter.    Marikay AlarEric Shneur [place], MD [facility]

## 2019-03-23 NOTE — Assessment & Plan Note (Addendum)
Status post fall.  Given pain in her low back and sacrum in the height of her fall we will obtain imaging to rule out underlying fracture.  She can continue Motrin.  Given return precautions. Work note provided to be out through the weekend.

## 2019-03-23 NOTE — Patient Instructions (Signed)
Nice to see you. Please go to [facility] to have your x-rays completed.  We will contact you with the results. You can continue the Motrin for pain relief. If you develop numbness, weakness, loss of bowel or bladder function, numbness between your legs, or fevers please seek medical attention immediately.

## 2019-03-23 NOTE — Telephone Encounter (Signed)
Patient inquiring about imaging results, please advise

## 2019-03-23 NOTE — Telephone Encounter (Signed)
Patient saw Dr. Caryl Bis requesting  Imaging results.

## 2019-03-24 NOTE — Telephone Encounter (Signed)
Called and verified that the patient saw her x-ray results on my chart and she did see them and she had no questions.  Cynthia Zuniga,cma

## 2019-03-29 ENCOUNTER — Encounter: Payer: Self-pay | Admitting: Family Medicine

## 2019-03-29 NOTE — Telephone Encounter (Signed)
Looks like pt saw Dr Caryl Bis last week.

## 2019-03-29 NOTE — Telephone Encounter (Signed)
Pt saw Dr Elliot Cousin. for a fall on 8/18 is no better  Ut has a CPE appt with Dr Olivia Mackie on 8/27. She wants to know if she can have another note for this week Mon thru Thursday. 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 Please FU with pt about note, you can send on MyChart. Must have for work today

## 2019-03-31 ENCOUNTER — Encounter: Payer: Self-pay | Admitting: Internal Medicine

## 2019-04-01 ENCOUNTER — Other Ambulatory Visit: Payer: Self-pay

## 2019-04-01 ENCOUNTER — Ambulatory Visit (INDEPENDENT_AMBULATORY_CARE_PROVIDER_SITE_OTHER): Payer: 59 | Admitting: Internal Medicine

## 2019-04-01 ENCOUNTER — Encounter: Payer: 59 | Admitting: Internal Medicine

## 2019-04-01 ENCOUNTER — Encounter: Payer: Self-pay | Admitting: Internal Medicine

## 2019-04-01 VITALS — BP 100/70 | Wt 107.0 lb

## 2019-04-01 DIAGNOSIS — M545 Low back pain, unspecified: Secondary | ICD-10-CM

## 2019-04-01 DIAGNOSIS — E785 Hyperlipidemia, unspecified: Secondary | ICD-10-CM

## 2019-04-01 DIAGNOSIS — M5136 Other intervertebral disc degeneration, lumbar region: Secondary | ICD-10-CM

## 2019-04-01 MED ORDER — PREDNISONE 20 MG PO TABS: 40.0000 mg | ORAL_TABLET | Freq: Every day | ORAL | 0 refills | Status: DC

## 2019-04-01 MED ORDER — TRAMADOL HCL 50 MG PO TABS: 50.0000 mg | ORAL_TABLET | Freq: Two times a day (BID) | ORAL | 0 refills | Status: DC | PRN

## 2019-04-01 MED ORDER — ROSUVASTATIN CALCIUM 10 MG PO TABS: 10.0000 mg | ORAL_TABLET | Freq: Every day | ORAL | 3 refills | Status: DC

## 2019-04-01 MED ORDER — CYCLOBENZAPRINE HCL 5 MG PO TABS: 5.0000 mg | ORAL_TABLET | Freq: Every evening | ORAL | 2 refills | Status: DC | PRN

## 2019-04-01 NOTE — Progress Notes (Signed)
Virtual Visit via Video Note  I connected with Cynthia Zuniga  on 04/01/19 at  9:30 AM EDT by a video enabled telemedicine application and verified that I am speaking with the correct person using two identifiers.  Location patient: home Location provider:work or home office Persons participating in the virtual visit: patient, provider  I discussed the limitations of evaluation and management by telemedicine and the availability of in person appointments. The patient expressed understanding and agreed to proceed.   HPI: Fall 03/22/19 3 ft off step ladder landing on her back pain is 6/10 worse with bending Xray low back with mild to moderate arthritis changes she has trouble bending no radiation of pain down legs, numbness/tingling, leg weakness. Small movements make pain worse. Tried heat and cold heat helps more than cold tried motrin tid otc but c/w stomach upset so taking bid now. Back is not better She missed multiple days of work 8/17, 18, 19, 20,24, 27, 28, and has to work 8/31, 9/1 and 04/07/19   ROS: See pertinent positives and negatives per HPI.  Past Medical History:  Diagnosis Date  . Ankle pain, chronic 06/22/2015  . Hyperlipidemia 07/05/2016    Past Surgical History:  Procedure Laterality Date  . APPENDECTOMY  1988    Family History  Problem Relation Age of Onset  . Heart disease Mother   . Heart disease Father   . Stroke Paternal Grandfather   . Hypertension Paternal Grandfather   . Diabetes Paternal Grandfather     SOCIAL HX: married    Current Outpatient Medications:  .  Cholecalciferol 50000 units capsule, Take 1 capsule (50,000 Units total) by mouth once a week., Disp: 13 capsule, Rfl: 1 .  cyclobenzaprine (FLEXERIL) 5 MG tablet, Take 1-2 tablets (5-10 mg total) by mouth at bedtime as needed for muscle spasms., Disp: 60 tablet, Rfl: 2 .  predniSONE (DELTASONE) 20 MG tablet, Take 2 tablets (40 mg total) by mouth daily with breakfast., Disp: 14 tablet, Rfl:  0 .  rosuvastatin (CRESTOR) 10 MG tablet, Take 1 tablet (10 mg total) by mouth daily. At night, Disp: 90 tablet, Rfl: 3 .  traMADol (ULTRAM) 50 MG tablet, Take 1 tablet (50 mg total) by mouth every 12 (twelve) hours as needed for up to 5 days., Disp: 10 tablet, Rfl: 0  EXAM:  VITALS per patient if applicable:  GENERAL: alert, oriented, appears well and in no acute distress  HEENT: atraumatic, conjunttiva clear, no obvious abnormalities on inspection of external nose and ears  NECK: normal movements of the head and neck  LUNGS: on inspection no signs of respiratory distress, breathing rate appears normal, no obvious gross SOB, gasping or wheezing  CV: no obvious cyanosis  MS: moves all visible extremities without noticeable abnormality  PSYCH/NEURO: pleasant and cooperative, no obvious depression or anxiety, speech and thought processing grossly intact  ASSESSMENT AND PLAN:  Discussed the following assessment and plan:  Acute midline low back pain without sciatica - Plan: traMADol (ULTRAM) 50 MG tablet, cyclobenzaprine (FLEXERIL) 5 MG tablet, predniSONE (DELTASONE) 20 MG tablet, MR Lumbar Spine Wo Contrast  Other intervertebral disc degeneration, lumbar region - Plan: MR Lumbar Spine Wo Contrast  -consider PT in future  Disc heat, aspercream/salonpas, lidocaine patch   HM Had flu shot will get 2020 at work  Consider shingrix in future  Tdap and hep B titer pt to check at employee health [facility] dates  Hep C negative  sch fasting labs  mammo sch 05/07/19  Given info  on cologaurd pt to call and check price with insurance never had colonoscopy and declines pt consider cologuard again Pap had 06/22/15 neg pap neg HPV overdue  Will do at f/u   Physical at f/u     I discussed the assessment and treatment plan with the patient. The patient was provided an opportunity to ask questions and all were answered. The patient agreed with the plan and demonstrated an understanding of the  instructions.   The patient was advised to call back or seek an in-person evaluation if the symptoms worsen or if the condition fails to improve as anticipated.  Time spent 15 minutes  Bevelyn Bucklesracy N McLean-Scocuzza, MD

## 2019-04-01 NOTE — Addendum Note (Signed)
Addended by: Orland Mustard on: 04/01/2019 10:46 AM   Modules accepted: Orders

## 2019-04-01 NOTE — Patient Instructions (Signed)
Call your insurance to see the cost of cologuard  CPT code [phone]   Back Exercises The following exercises strengthen the muscles that help to support the trunk and back. They also help to keep the lower back flexible. Doing these exercises can help to prevent back pain or lessen existing pain.  If you have back pain or discomfort, try doing these exercises 2-3 times each day or as told by your health care provider.  As your pain improves, do them once each day, but increase the number of times that you repeat the steps for each exercise (do more repetitions).  To prevent the recurrence of back pain, continue to do these exercises once each day or as told by your health care provider. Do exercises exactly as told by your health care provider and adjust them as directed. It is normal to feel mild stretching, pulling, tightness, or discomfort as you do these exercises, but you should stop right away if you feel sudden pain or your pain gets worse. Exercises Single knee to chest Repeat these steps 3-5 times for each leg: 1. Lie on your back on a firm bed or the floor with your legs extended. 2. Bring one knee to your chest. Your other leg should stay extended and in contact with the floor. 3. Hold your knee in place by grabbing your knee or thigh with both hands and hold. 4. Pull on your knee until you feel a gentle stretch in your lower back or buttocks. 5. Hold the stretch for 10-30 seconds. 6. Slowly release and straighten your leg. Pelvic tilt Repeat these steps 5-10 times: 1. Lie on your back on a firm bed or the floor with your legs extended. 2. Bend your knees so they are pointing toward the ceiling and your feet are flat on the floor. 3. Tighten your lower abdominal muscles to press your lower back against the floor. This motion will tilt your pelvis so your tailbone points up toward the ceiling instead of pointing to your feet or the floor. 4. With gentle tension and even breathing,  hold this position for 5-10 seconds. Cat-cow Repeat these steps until your lower back becomes more flexible: 1. Get into a hands-and-knees position on a firm surface. Keep your hands under your shoulders, and keep your knees under your hips. You may place padding under your knees for comfort. 2. Let your head hang down toward your chest. Contract your abdominal muscles and point your tailbone toward the floor so your lower back becomes rounded like the back of a cat. 3. Hold this position for 5 seconds. 4. Slowly lift your head, let your abdominal muscles relax and point your tailbone up toward the ceiling so your back forms a sagging arch like the back of a cow. 5. Hold this position for 5 seconds.  Press-ups Repeat these steps 5-10 times: 1. Lie on your abdomen (face-down) on the floor. 2. Place your palms near your head, about shoulder-width apart. 3. Keeping your back as relaxed as possible and keeping your hips on the floor, slowly straighten your arms to raise the top half of your body and lift your shoulders. Do not use your back muscles to raise your upper torso. You may adjust the placement of your hands to make yourself more comfortable. 4. Hold this position for 5 seconds while you keep your back relaxed. 5. Slowly return to lying flat on the floor.  Bridges Repeat these steps 10 times: 1. Lie on your back on  a firm surface. 2. Bend your knees so they are pointing toward the ceiling and your feet are flat on the floor. Your arms should be flat at your sides, next to your body. 3. Tighten your buttocks muscles and lift your buttocks off the floor until your waist is at almost the same height as your knees. You should feel the muscles working in your buttocks and the back of your thighs. If you do not feel these muscles, slide your feet 1-2 inches farther away from your buttocks. 4. Hold this position for 3-5 seconds. 5. Slowly lower your hips to the starting position, and allow your  buttocks muscles to relax completely. If this exercise is too easy, try doing it with your arms crossed over your chest. Abdominal crunches Repeat these steps 5-10 times: 1. Lie on your back on a firm bed or the floor with your legs extended. 2. Bend your knees so they are pointing toward the ceiling and your feet are flat on the floor. 3. Cross your arms over your chest. 4. Tip your chin slightly toward your chest without bending your neck. 5. Tighten your abdominal muscles and slowly raise your trunk (torso) high enough to lift your shoulder blades a tiny bit off the floor. Avoid raising your torso higher than that because it can put too much stress on your low back and does not help to strengthen your abdominal muscles. 6. Slowly return to your starting position. Back lifts Repeat these steps 5-10 times: 1. Lie on your abdomen (face-down) with your arms at your sides, and rest your forehead on the floor. 2. Tighten the muscles in your legs and your buttocks. 3. Slowly lift your chest off the floor while you keep your hips pressed to the floor. Keep the back of your head in line with the curve in your back. Your eyes should be looking at the floor. 4. Hold this position for 3-5 seconds. 5. Slowly return to your starting position. Contact a health care provider if:  Your back pain or discomfort gets much worse when you do an exercise.  Your worsening back pain or discomfort does not lessen within 2 hours after you exercise. If you have any of these problems, stop doing these exercises right away. Do not do them again unless your health care provider says that you can. Get help right away if:  You develop sudden, severe back pain. If this happens, stop doing the exercises right away. Do not do them again unless your health care provider says that you can. This information is not intended to replace advice given to you by your health care provider. Make sure you discuss any questions you  have with your health care provider. Document Released: 08/29/2004 Document Revised: 11/26/2018 Document Reviewed: 04/23/2018 Elsevier Patient Education  2020 [employer]

## 2019-04-05 ENCOUNTER — Telehealth: Payer: Self-pay

## 2019-04-05 ENCOUNTER — Encounter: Payer: Self-pay | Admitting: Internal Medicine

## 2019-04-05 DIAGNOSIS — Z0279 Encounter for issue of other medical certificate: Secondary | ICD-10-CM

## 2019-04-05 NOTE — Telephone Encounter (Signed)
We will call her and let them know when doing likely tomorrow when I return they will be complete for pick up  I just received them Friday and it takes 1-2 business weeks to fill forms out with my daily patient load and tasks   [place]

## 2019-04-05 NOTE — Telephone Encounter (Signed)
Copied from [place] [id number]. Topic: General - Other >> Apr 05, 2019 11:08 AM Keene Breath wrote: Reason for CRM: Patient called to speak with the nurse regarding FMLA papers.  Patient stated that she needs it as soon as possible.  Please advise and call patient back at [phone]

## 2019-04-06 ENCOUNTER — Encounter: Payer: Self-pay | Admitting: Internal Medicine

## 2019-04-06 NOTE — Telephone Encounter (Signed)
Patient has been informed.

## 2019-04-06 NOTE — Telephone Encounter (Signed)
Patient would like a new note to go to the [id number]. Please advise. She stated she spoken to PCP about this.

## 2019-04-14 ENCOUNTER — Other Ambulatory Visit: Payer: Self-pay

## 2019-04-14 ENCOUNTER — Ambulatory Visit
Admission: RE | Admit: 2019-04-14 | Discharge: 2019-04-14 | Disposition: A | Discharge status: Home / Self Care | Payer: 59 | Source: Ambulatory Visit | Attending: Internal Medicine | Admitting: Internal Medicine

## 2019-04-14 DIAGNOSIS — M5136 Other intervertebral disc degeneration, lumbar region: Secondary | ICD-10-CM | POA: Insufficient documentation

## 2019-04-14 DIAGNOSIS — S3210XA Unspecified fracture of sacrum, initial encounter for closed fracture: Secondary | ICD-10-CM | POA: Diagnosis not present

## 2019-04-14 DIAGNOSIS — M545 Low back pain, unspecified: Secondary | ICD-10-CM

## 2019-04-15 ENCOUNTER — Telehealth: Payer: Self-pay

## 2019-04-15 ENCOUNTER — Other Ambulatory Visit: Payer: Self-pay | Admitting: Family Medicine

## 2019-04-15 DIAGNOSIS — M545 Low back pain, unspecified: Secondary | ICD-10-CM

## 2019-04-15 DIAGNOSIS — S3210XA Unspecified fracture of sacrum, initial encounter for closed fracture: Secondary | ICD-10-CM

## 2019-04-15 NOTE — Telephone Encounter (Signed)
Copied from [place] [id number]. Topic: General - Other >> Apr 15, 2019  3:49 PM Reyne Dumas L wrote: Reason for CRM:   Pt states that she needs an extension on her FL[employer] paperwork, states that it only goes through 04/19/2019.  Pt can be reached at [phone]

## 2019-04-15 NOTE — Progress Notes (Signed)
Sacral fracture on MRI  Referral to neurosurgery in

## 2019-04-16 NOTE — Telephone Encounter (Signed)
Patient is calling to speak with Fransisco Beau regarding her FMLA papers.  Please call patient back at [phone]

## 2019-04-20 ENCOUNTER — Other Ambulatory Visit: Payer: Self-pay | Admitting: Internal Medicine

## 2019-04-20 ENCOUNTER — Telehealth: Payer: Self-pay

## 2019-04-20 ENCOUNTER — Encounter: Payer: Self-pay | Admitting: Internal Medicine

## 2019-04-20 DIAGNOSIS — S3210XD Unspecified fracture of sacrum, subsequent encounter for fracture with routine healing: Secondary | ICD-10-CM

## 2019-04-20 NOTE — Telephone Encounter (Signed)
whats going on with referral?  I actually want to refer to Ortho    Thanks [place]

## 2019-04-20 NOTE — Telephone Encounter (Signed)
Copied from [place] [phone]. Topic: General - Other >> Apr 15, 2019  3:49 PM Reyne Dumas L wrote: Reason for CRM:   Pt states that she needs an extension on her FLMA paperwork, states that it only goes through 04/19/2019.  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 Patient would also like an update on a consultation she say she was to have with a neurosurgeon asking for a call back at Ph# [phone]

## 2019-04-20 NOTE — Telephone Encounter (Signed)
Copied from CRM #[id number]. Topic: General - Other >> Apr 15, 2019  3:49 PM Alexander, Amber L wrote: Reason for CRM:   Pt states that she needs an extension on her FLMA paperwork, states that it only goes through 04/19/2019.  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 Patient would also like an update on a consultation she say she was to have with a neurosurgeon asking for a call back at Ph# [phone] 

## 2019-04-20 NOTE — Telephone Encounter (Signed)
Pt need to speak to nurse concerning getting an extension on her FMLA. Pt said she has requested this last week,

## 2019-04-21 ENCOUNTER — Other Ambulatory Visit: Payer: Self-pay

## 2019-04-21 ENCOUNTER — Other Ambulatory Visit (INDEPENDENT_AMBULATORY_CARE_PROVIDER_SITE_OTHER): Payer: 59

## 2019-04-21 ENCOUNTER — Encounter: Payer: Self-pay | Admitting: Internal Medicine

## 2019-04-21 DIAGNOSIS — Z0000 Encounter for general adult medical examination without abnormal findings: Secondary | ICD-10-CM

## 2019-04-21 DIAGNOSIS — R739 Hyperglycemia, unspecified: Secondary | ICD-10-CM

## 2019-04-21 DIAGNOSIS — Z1329 Encounter for screening for other suspected endocrine disorder: Secondary | ICD-10-CM

## 2019-04-21 DIAGNOSIS — E559 Vitamin D deficiency, unspecified: Secondary | ICD-10-CM | POA: Diagnosis not present

## 2019-04-21 DIAGNOSIS — E785 Hyperlipidemia, unspecified: Secondary | ICD-10-CM

## 2019-04-21 DIAGNOSIS — Z1389 Encounter for screening for other disorder: Secondary | ICD-10-CM | POA: Diagnosis not present

## 2019-04-21 LAB — COMPREHENSIVE METABOLIC PANEL
ALT: 25 U/L (ref 0–35)
AST: 23 U/L (ref 0–37)
Albumin: 4.6 g/dL (ref 3.5–5.2)
Alkaline Phosphatase: 78 U/L (ref 39–117)
BUN: 8 mg/dL (ref 6–23)
CO2: 30 mEq/L (ref 19–32)
Calcium: 10 mg/dL (ref 8.4–10.5)
Chloride: 104 mEq/L (ref 96–112)
Creatinine, Ser: 0.71 mg/dL (ref 0.40–1.20)
GFR: 83.87 mL/min (ref >60.00)
Glucose, Bld: 98 mg/dL (ref 70–99)
Potassium: 3.9 mEq/L (ref 3.5–5.1)
Sodium: 141 mEq/L (ref 135–145)
Total Bilirubin: 1.5 mg/dL — ABNORMAL HIGH (ref 0.2–1.2)
Total Protein: 7.2 g/dL (ref 6.0–8.3)

## 2019-04-21 LAB — LIPID PANEL
Cholesterol: 165 mg/dL (ref 0–200)
HDL: 74 mg/dL (ref >39.00)
LDL Cholesterol: 74 mg/dL (ref 0–99)
NonHDL: 91.15
Total CHOL/HDL Ratio: 2
Triglycerides: 88 mg/dL (ref 0.0–149.0)
VLDL: 17.6 mg/dL (ref 0.0–40.0)

## 2019-04-21 LAB — URINALYSIS, ROUTINE W REFLEX MICROSCOPIC
Bilirubin Urine: NEGATIVE
Hgb urine dipstick: NEGATIVE
Ketones, ur: NEGATIVE
Nitrite: NEGATIVE
RBC / HPF: NONE SEEN (ref 0–?)
Specific Gravity, Urine: <=1.005 — AB (ref 1.000–1.030)
Total Protein, Urine: NEGATIVE
Urine Glucose: NEGATIVE
Urobilinogen, UA: 0.2 (ref 0.0–1.0)
pH: 6 (ref 5.0–8.0)

## 2019-04-21 LAB — CBC WITH DIFFERENTIAL/PLATELET
Basophils Absolute: 0 10*3/uL (ref 0.0–0.1)
Basophils Relative: 0.4 % (ref 0.0–3.0)
Eosinophils Absolute: 0.1 10*3/uL (ref 0.0–0.7)
Eosinophils Relative: 0.9 % (ref 0.0–5.0)
HCT: 38.8 % (ref 36.0–46.0)
Hemoglobin: 12.9 g/dL (ref 12.0–15.0)
Lymphocytes Relative: 29.7 % (ref 12.0–46.0)
Lymphs Abs: 1.9 10*3/uL (ref 0.7–4.0)
MCHC: 33.2 g/dL (ref 30.0–36.0)
MCV: 89.7 fl (ref 78.0–100.0)
Monocytes Absolute: 0.3 10*3/uL (ref 0.1–1.0)
Monocytes Relative: 4.4 % (ref 3.0–12.0)
Neutro Abs: 4.1 10*3/uL (ref 1.4–7.7)
Neutrophils Relative %: 64.6 % (ref 43.0–77.0)
Platelets: 151 10*3/uL (ref 150.0–400.0)
RBC: 4.33 Mil/uL (ref 3.87–5.11)
RDW: 13.5 % (ref 11.5–15.5)
WBC: 6.4 10*3/uL (ref 4.0–10.5)

## 2019-04-21 LAB — HEMOGLOBIN A1C: Hgb A1c MFr Bld: 6.2 % (ref 4.6–6.5)

## 2019-04-21 LAB — T4, FREE: Free T4: 0.92 ng/dL (ref 0.60–1.60)

## 2019-04-21 LAB — TSH: TSH: 0.9 u[IU]/mL (ref 0.35–4.50)

## 2019-04-21 LAB — VITAMIN D 25 HYDROXY (VIT D DEFICIENCY, FRACTURES): VITD: 31.81 ng/mL (ref 30.00–100.00)

## 2019-04-21 NOTE — Telephone Encounter (Signed)
Neurosurgery scheduled 9/22 Since ortho referral was just placed yesterday, I have not had a chance to do it yet, but will be sent to [facility] today. [place]

## 2019-04-21 NOTE — Telephone Encounter (Signed)
Letter ready and FMLA paperwork please change the dates to be out until 04/30/2019 and re fax paperwork asap  [place]

## 2019-04-21 NOTE — Telephone Encounter (Signed)
Did you finsh this? I know you grabbed the FMLA paperwork this morning.

## 2019-04-23 ENCOUNTER — Encounter: Payer: Self-pay | Admitting: Internal Medicine

## 2019-04-23 NOTE — Telephone Encounter (Signed)
Checking on ortho referral   Please sch asap

## 2019-04-23 NOTE — Telephone Encounter (Signed)
Pt is calling checking on ortho referral

## 2019-04-27 DIAGNOSIS — W11XXXA Fall on and from ladder, initial encounter: Secondary | ICD-10-CM | POA: Diagnosis not present

## 2019-04-27 DIAGNOSIS — S32110A Nondisplaced Zone I fracture of sacrum, initial encounter for closed fracture: Secondary | ICD-10-CM | POA: Diagnosis not present

## 2019-04-27 DIAGNOSIS — M5489 Other dorsalgia: Secondary | ICD-10-CM | POA: Diagnosis not present

## 2019-04-28 NOTE — Telephone Encounter (Signed)
Pt was seen by Rachelle Hora on 9/22

## 2019-04-29 ENCOUNTER — Other Ambulatory Visit: Payer: Self-pay

## 2019-04-30 ENCOUNTER — Ambulatory Visit (INDEPENDENT_AMBULATORY_CARE_PROVIDER_SITE_OTHER): Payer: 59 | Admitting: Internal Medicine

## 2019-04-30 ENCOUNTER — Other Ambulatory Visit: Payer: Self-pay

## 2019-04-30 ENCOUNTER — Encounter: Payer: Self-pay | Admitting: Internal Medicine

## 2019-04-30 VITALS — BP 132/70 | HR 87 | Temp 98.5°F | Ht 62.0 in | Wt 107.6 lb

## 2019-04-30 DIAGNOSIS — M545 Low back pain, unspecified: Secondary | ICD-10-CM

## 2019-04-30 DIAGNOSIS — R7303 Prediabetes: Secondary | ICD-10-CM | POA: Diagnosis not present

## 2019-04-30 DIAGNOSIS — Z0000 Encounter for general adult medical examination without abnormal findings: Secondary | ICD-10-CM | POA: Diagnosis not present

## 2019-04-30 DIAGNOSIS — E2839 Other primary ovarian failure: Secondary | ICD-10-CM

## 2019-04-30 DIAGNOSIS — S3210XD Unspecified fracture of sacrum, subsequent encounter for fracture with routine healing: Secondary | ICD-10-CM

## 2019-04-30 DIAGNOSIS — R17 Unspecified jaundice: Secondary | ICD-10-CM

## 2019-04-30 DIAGNOSIS — E785 Hyperlipidemia, unspecified: Secondary | ICD-10-CM | POA: Diagnosis not present

## 2019-04-30 HISTORY — DX: Unspecified fracture of sacrum, subsequent encounter for fracture with routine healing: S32.10XD

## 2019-04-30 LAB — BILIRUBIN, TOTAL: Total Bilirubin: 0.9 mg/dL (ref 0.2–1.2)

## 2019-04-30 LAB — BILIRUBIN, DIRECT: Bilirubin, Direct: 0.1 mg/dL (ref 0.0–0.3)

## 2019-04-30 MED ORDER — TRAMADOL HCL 50 MG PO TABS: 50.0000 mg | ORAL_TABLET | Freq: Two times a day (BID) | ORAL | 0 refills | Status: AC | PRN

## 2019-04-30 MED ORDER — ROSUVASTATIN CALCIUM 5 MG PO TABS: 5.0000 mg | ORAL_TABLET | Freq: Every day | ORAL | 3 refills | Status: DC

## 2019-04-30 NOTE — Patient Instructions (Addendum)
D3 4000 Iu daily   Let me know about cologuard when you want to do this   F/u In 3-6 months pap

## 2019-04-30 NOTE — Progress Notes (Signed)
Chief Complaint  Patient presents with  . Annual Exam   Annual  1. HLD on crestor 10 mg qhs cholesterol stable after reduced dose will taper down  2. Sacral fractures b/l saw NS and ortho 04/27/19 needs to be out of work 10-12 weeks and paperwork to reflect this needs letter of extension out of work will go back 06/07/2019 no need for surgery just rest and rec DEXA -pain was 8/10 now 4-5/10 low back   3. Total bilirubin elevated will w/u   Review of Systems  Constitutional: Negative for weight loss.  HENT: Negative for hearing loss.   Eyes: Negative for blurred vision.  Respiratory: Negative for shortness of breath.   Cardiovascular: Negative for chest pain.  Gastrointestinal: Negative for abdominal pain.  Musculoskeletal: Positive for back pain and joint pain.  Skin: Negative for rash.  Neurological: Negative for headaches.  Psychiatric/Behavioral: Negative for depression.   Past Medical History:  Diagnosis Date  . Ankle pain, chronic 06/22/2015  . Hyperlipidemia 07/05/2016   Past Surgical History:  Procedure Laterality Date  . APPENDECTOMY  1988   Family History  Problem Relation Age of Onset  . Heart disease Mother   . Heart disease Father   . Stroke Paternal Grandfather   . Hypertension Paternal Grandfather   . Diabetes Paternal Grandfather    Social History   Socioeconomic History  . Marital status: Married    Spouse name: Not on file  . Number of children: Not on file  . Years of education: Not on file  . Highest education level: Not on file  Occupational History  . Not on file  Social Needs  . Financial resource strain: Not on file  . Food insecurity    Worry: Not on file    Inability: Not on file  . Transportation needs    Medical: Not on file    Non-medical: Not on file  Tobacco Use  . Smoking status: Never Smoker  . Smokeless tobacco: Never Used  Substance and Sexual Activity  . Alcohol use: No  . Drug use: No  . Sexual activity: Not on file   Lifestyle  . Physical activity    Days per week: Not on file    Minutes per session: Not on file  . Stress: Not on file  Relationships  . Social Musician on phone: Not on file    Gets together: Not on file    Attends religious service: Not on file    Active member of club or organization: Not on file    Attends meetings of clubs or organizations: Not on file    Relationship status: Not on file  . Intimate partner violence    Fear of current or ex partner: Not on file    Emotionally abused: Not on file    Physically abused: Not on file    Forced sexual activity: Not on file  Other Topics Concern  . Not on file  Social History Narrative   Married    Kids    From [place] family still there    Nurse in ICU   Current Meds  Medication Sig  . Cholecalciferol 50000 units capsule Take 1 capsule (50,000 Units total) by mouth once a week.  . cyclobenzaprine (FLEXERIL) 5 MG tablet Take 1-2 tablets (5-10 mg total) by mouth at bedtime as needed for muscle spasms.  . rosuvastatin (CRESTOR) 5 MG tablet Take 1 tablet (5 mg total) by mouth daily. At night  . [  DISCONTINUED] rosuvastatin (CRESTOR) 10 MG tablet Take 1 tablet (10 mg total) by mouth daily. At night   No Known Allergies Recent Results (from the past 2160 hour(s))  POCT urine pregnancy     Status: None   Collection Time: 03/23/19 11:59 AM  Result Value Ref Range   Preg Test, Ur Negative Negative  Hemoglobin A1c     Status: None   Collection Time: 04/21/19  9:36 AM  Result Value Ref Range   Hgb A1c MFr Bld 6.2 4.6 - 6.5 %    Comment: Glycemic Control Guidelines for People with Diabetes:Non Diabetic:  <6%Goal of Therapy: <7%Additional Action Suggested:  >8%   Vitamin D (25 hydroxy)     Status: None   Collection Time: 04/21/19  9:36 AM  Result Value Ref Range   VITD 31.81 30.00 - 100.00 ng/mL  Urinalysis, Routine w reflex microscopic     Status: Abnormal   Collection Time: 04/21/19  9:36 AM  Result Value Ref Range    Color, Urine YELLOW Yellow;Lt. Yellow;Straw;Dark Yellow;Amber;Green;Red;Brown   APPearance CLEAR Clear;Turbid;Slightly Cloudy;Cloudy   Specific Gravity, Urine <=1.005 (A) 1.000 - 1.030   pH 6.0 5.0 - 8.0   Total Protein, Urine NEGATIVE Negative   Urine Glucose NEGATIVE Negative   Ketones, ur NEGATIVE Negative   Bilirubin Urine NEGATIVE Negative   Hgb urine dipstick NEGATIVE Negative   Urobilinogen, UA 0.2 0.0 - 1.0   Leukocytes,Ua SMALL (A) Negative   Nitrite NEGATIVE Negative   WBC, UA 3-6/hpf (A) 0-2/hpf   RBC / HPF none seen 0-2/hpf   Squamous Epithelial / LPF Rare(0-4/hpf) Rare(0-4/hpf)  T4, free     Status: None   Collection Time: 04/21/19  9:36 AM  Result Value Ref Range   Free T4 0.92 0.60 - 1.60 ng/dL    Comment: Specimens from patients who are undergoing biotin therapy and /or ingesting biotin supplements may contain high levels of biotin.  The higher biotin concentration in these specimens interferes with this Free T4 assay.  Specimens that contain high levels  of biotin may cause false high results for this Free T4 assay.  Please interpret results in light of the total clinical presentation of the patient.    TSH     Status: None   Collection Time: 04/21/19  9:36 AM  Result Value Ref Range   TSH 0.90 0.35 - 4.50 uIU/mL  Lipid panel     Status: None   Collection Time: 04/21/19  9:36 AM  Result Value Ref Range   Cholesterol 165 0 - 200 mg/dL    Comment: ATP III Classification       Desirable:  < 200 mg/dL               Borderline High:  200 - 239 mg/dL          High:  > = 161240 mg/dL   Triglycerides 09.688.0 0.0 - 149.0 mg/dL    Comment: Normal:  <045<150 mg/dLBorderline High:  150 - 199 mg/dL   HDL 40.9874.00 >11.91>39.00 mg/dL   VLDL 47.817.6 0.0 - 29.540.0 mg/dL   LDL Cholesterol 74 0 - 99 mg/dL   Total CHOL/HDL Ratio 2     Comment:                Men          Women1/2 Average Risk     3.4          3.3Average Risk  5.0          4.42X Average Risk          9.6          7.13X Average  Risk          15.0          11.0                       NonHDL 91.15     Comment: NOTE:  Non-HDL goal should be 30 mg/dL higher than patient's LDL goal (i.e. LDL goal of < 70 mg/dL, would have non-HDL goal of < 100 mg/dL)  CBC w/Diff     Status: None   Collection Time: 04/21/19  9:36 AM  Result Value Ref Range   WBC 6.4 4.0 - 10.5 K/uL   RBC 4.33 3.87 - 5.11 Mil/uL   Hemoglobin 12.9 12.0 - 15.0 g/dL   HCT 38.8 36.0 - 46.0 %   MCV 89.7 78.0 - 100.0 fl   MCHC 33.2 30.0 - 36.0 g/dL   RDW 13.5 11.5 - 15.5 %   Platelets 151.0 150.0 - 400.0 K/uL   Neutrophils Relative % 64.6 43.0 - 77.0 %   Lymphocytes Relative 29.7 12.0 - 46.0 %   Monocytes Relative 4.4 3.0 - 12.0 %   Eosinophils Relative 0.9 0.0 - 5.0 %   Basophils Relative 0.4 0.0 - 3.0 %   Neutro Abs 4.1 1.4 - 7.7 K/uL   Lymphs Abs 1.9 0.7 - 4.0 K/uL   Monocytes Absolute 0.3 0.1 - 1.0 K/uL   Eosinophils Absolute 0.1 0.0 - 0.7 K/uL   Basophils Absolute 0.0 0.0 - 0.1 K/uL  Comprehensive metabolic panel     Status: Abnormal   Collection Time: 04/21/19  9:36 AM  Result Value Ref Range   Sodium 141 135 - 145 mEq/L   Potassium 3.9 3.5 - 5.1 mEq/L   Chloride 104 96 - 112 mEq/L   CO2 30 19 - 32 mEq/L   Glucose, Bld 98 70 - 99 mg/dL   BUN 8 6 - 23 mg/dL   Creatinine, Ser 0.71 0.40 - 1.20 mg/dL   Total Bilirubin 1.5 (H) 0.2 - 1.2 mg/dL   Alkaline Phosphatase 78 39 - 117 U/L   AST 23 0 - 37 U/L   ALT 25 0 - 35 U/L   Total Protein 7.2 6.0 - 8.3 g/dL   Albumin 4.6 3.5 - 5.2 g/dL   Calcium 10.0 8.4 - 10.5 mg/dL   GFR 83.87 >60.00 mL/min   Objective  Body mass index is 19.68 kg/m. Wt Readings from Last 3 Encounters:  04/30/19 107 lb 9.6 oz (48.8 kg)  04/01/19 107 lb (48.5 kg)  03/23/19 107 lb 9.6 oz (48.8 kg)   Temp Readings from Last 3 Encounters:  04/30/19 98.5 F (36.9 C) (Oral)  03/23/19 97.9 F (36.6 C) (Temporal)  11/13/17 98 F (36.7 C) (Oral)   BP Readings from Last 3 Encounters:  04/30/19 132/70  04/01/19 100/70   03/23/19 100/70   Pulse Readings from Last 3 Encounters:  04/30/19 87  03/23/19 83  11/13/17 86    Physical Exam Vitals signs and nursing note reviewed.  Constitutional:      Appearance: Normal appearance. She is well-developed and well-groomed.     Comments: +mask on   HENT:     Head: Normocephalic and atraumatic.  Cardiovascular:     Rate and Rhythm: Normal rate and regular rhythm.  Heart sounds: Normal heart sounds. No murmur.  Pulmonary:     Effort: Pulmonary effort is normal.     Breath sounds: Normal breath sounds.  Skin:    General: Skin is warm and dry.  Neurological:     General: No focal deficit present.     Mental Status: She is alert and oriented to person, place, and time. Mental status is at baseline.     Gait: Gait normal.  Psychiatric:        Attention and Perception: Attention and perception normal.        Mood and Affect: Mood and affect normal.        Speech: Speech normal.        Behavior: Behavior normal. Behavior is cooperative.        Thought Content: Thought content normal.        Cognition and Memory: Cognition and memory normal.        Judgment: Judgment normal.     Assessment  Plan  Annual physical exam Had flu shot t 04/25/19 at work  Consider shingrix in future  Tdap and hep B titer pt to check at employee health [facility] dates  Pt thinks < 10 years as of 04/30/19  Hep C negative  mammo sch 05/07/19  Will order DEXA 05/07/2019 as well  Given info on cologaurd pt to call and check price with insurance never had colonoscopy and declines pt consider cologuard again today 04/30/19  Pap had 06/22/15 neg pap neg HPV overdue  Wanted to do today sacral fractures preventing will do in 3-6 months    Prediabetes -healthy diet and exercise   Closed fracture of sacrum with routine healing, unspecified portion of sacrum, subsequent encounter - Plan: DG Bone Density -saw ns and [facility] 04/27/19 rest out of work 10 weeks return 06/07/2019    Estrogen deficiency - Plan: DG Bone Density  Hyperlipidemia, unspecified hyperlipidemia type - Plan: rosuvastatin (CRESTOR) 5 MG tablet reduce from 10 mg qhs  Elevated bilirubin - Plan: Bilirubin, Total, Bilirubin, Direct If elevated again will do US abdomen   Acute midline low back pain likely 2/2 sacral fractuers - Plan: traMADol (ULTRAM) 50 MG tablet  Provider: Dr. French Ana McLean-Scocuzza-Internal Medicine

## 2019-05-06 ENCOUNTER — Telehealth: Payer: Self-pay

## 2019-05-06 NOTE — Telephone Encounter (Signed)
Copied from [place] [phone]. Topic: General - Inquiry >> May 06, 2019  2:23 PM Scherrie Gerlach wrote: Reason for CRM: pt states her employer has sent over short term disability papers from [place], and they need them back asap. Pt would like a call back if you have not received.

## 2019-05-07 ENCOUNTER — Ambulatory Visit
Admission: RE | Admit: 2019-05-07 | Discharge: 2019-05-07 | Disposition: A | Discharge status: Home / Self Care | Payer: 59 | Source: Ambulatory Visit | Attending: Internal Medicine | Admitting: Internal Medicine

## 2019-05-07 ENCOUNTER — Encounter: Payer: Self-pay | Admitting: Internal Medicine

## 2019-05-07 DIAGNOSIS — M810 Age-related osteoporosis without current pathological fracture: Secondary | ICD-10-CM | POA: Diagnosis not present

## 2019-05-07 DIAGNOSIS — S3210XD Unspecified fracture of sacrum, subsequent encounter for fracture with routine healing: Secondary | ICD-10-CM | POA: Insufficient documentation

## 2019-05-07 DIAGNOSIS — Z1231 Encounter for screening mammogram for malignant neoplasm of breast: Secondary | ICD-10-CM | POA: Insufficient documentation

## 2019-05-07 DIAGNOSIS — E2839 Other primary ovarian failure: Secondary | ICD-10-CM | POA: Insufficient documentation

## 2019-05-07 DIAGNOSIS — Z780 Asymptomatic menopausal state: Secondary | ICD-10-CM | POA: Diagnosis not present

## 2019-05-07 DIAGNOSIS — Z0279 Encounter for issue of other medical certificate: Secondary | ICD-10-CM

## 2019-05-10 NOTE — Telephone Encounter (Signed)
Patient is wanting to know the status of the FMLA paperwork.

## 2019-05-11 NOTE — Telephone Encounter (Signed)
Cynthia Zuniga call pt about paperwork FMLA/short term ready   [place]

## 2019-05-11 NOTE — Telephone Encounter (Signed)
Patient is waiting on FMLA paperwork she wants a update.  Nina,cma

## 2019-05-11 NOTE — Telephone Encounter (Signed)
Pt called and is requesting an update. Please advise.  °

## 2019-05-12 NOTE — Telephone Encounter (Signed)
Patients husband was informed. He will pick up forms at appointment tomorrow.

## 2019-05-17 ENCOUNTER — Other Ambulatory Visit: Payer: Self-pay

## 2019-05-18 ENCOUNTER — Ambulatory Visit: Payer: 59 | Admitting: Internal Medicine

## 2019-05-18 ENCOUNTER — Other Ambulatory Visit: Payer: Self-pay

## 2019-05-18 ENCOUNTER — Encounter: Payer: Self-pay | Admitting: Internal Medicine

## 2019-05-18 VITALS — BP 122/68 | HR 75 | Temp 97.5°F | Ht 62.0 in | Wt 108.4 lb

## 2019-05-18 DIAGNOSIS — Z1231 Encounter for screening mammogram for malignant neoplasm of breast: Secondary | ICD-10-CM | POA: Diagnosis not present

## 2019-05-18 DIAGNOSIS — M8000XA Age-related osteoporosis with current pathological fracture, unspecified site, initial encounter for fracture: Secondary | ICD-10-CM | POA: Insufficient documentation

## 2019-05-18 DIAGNOSIS — E785 Hyperlipidemia, unspecified: Secondary | ICD-10-CM | POA: Diagnosis not present

## 2019-05-18 DIAGNOSIS — M8000XD Age-related osteoporosis with current pathological fracture, unspecified site, subsequent encounter for fracture with routine healing: Secondary | ICD-10-CM

## 2019-05-18 HISTORY — DX: Age-related osteoporosis with current pathological fracture, unspecified site, initial encounter for fracture: M80.00XA

## 2019-05-18 MED ORDER — ALENDRONATE SODIUM 70 MG PO TABS: 70.0000 mg | ORAL_TABLET | ORAL | 3 refills | Status: DC

## 2019-05-18 MED ORDER — ROSUVASTATIN CALCIUM 5 MG PO TABS: 5.0000 mg | ORAL_TABLET | Freq: Every day | ORAL | 3 refills | Status: DC

## 2019-05-18 NOTE — Progress Notes (Signed)
Chief Complaint  Patient presents with  . Follow-up   1. Sacral fractures b/l s/p fall with dexa 05/07/19 osteoporosis + ?FH osteoporosis taking D3 4000 IU daily and not calcium for now  Back pain 1/10 from 4/10   Review of Systems  Constitutional: Negative for weight loss.  HENT: Negative for hearing loss.   Eyes: Negative for blurred vision.  Respiratory: Negative for shortness of breath.   Cardiovascular: Negative for chest pain.  Musculoskeletal: Positive for back pain.  Skin: Negative for rash.  Neurological: Negative for headaches.  Psychiatric/Behavioral: Negative for depression.   Past Medical History:  Diagnosis Date  . Ankle pain, chronic 06/22/2015  . Hyperlipidemia 07/05/2016   Past Surgical History:  Procedure Laterality Date  . APPENDECTOMY  1988   Family History  Problem Relation Age of Onset  . Heart disease Mother   . Heart disease Father   . Stroke Paternal Grandfather   . Hypertension Paternal Grandfather   . Diabetes Paternal Grandfather   . Breast cancer Neg Hx    Social History   Socioeconomic History  . Marital status: Married    Spouse name: Not on file  . Number of children: Not on file  . Years of education: Not on file  . Highest education level: Not on file  Occupational History  . Not on file  Social Needs  . Financial resource strain: Not on file  . Food insecurity    Worry: Not on file    Inability: Not on file  . Transportation needs    Medical: Not on file    Non-medical: Not on file  Tobacco Use  . Smoking status: Never Smoker  . Smokeless tobacco: Never Used  Substance and Sexual Activity  . Alcohol use: No  . Drug use: No  . Sexual activity: Not on file  Lifestyle  . Physical activity    Days per week: Not on file    Minutes per session: Not on file  . Stress: Not on file  Relationships  . Social Musicianconnections    Talks on phone: Not on file    Gets together: Not on file    Attends religious service: Not on file   Active member of club or organization: Not on file    Attends meetings of clubs or organizations: Not on file    Relationship status: Not on file  . Intimate partner violence    Fear of current or ex partner: Not on file    Emotionally abused: Not on file    Physically abused: Not on file    Forced sexual activity: Not on file  Other Topics Concern  . Not on file  Social History Narrative   Married    Kids    From [place] still [place]    Nurse in ICU   Current Meds  Medication Sig  . Cholecalciferol 50000 units capsule Take 1 capsule (50,000 Units total) by mouth once a week.  . cyclobenzaprine (FLEXERIL) 5 MG tablet Take 1-2 tablets (5-10 mg total) by mouth at bedtime as needed for muscle spasms.  . rosuvastatin (CRESTOR) 5 MG tablet Take 1 tablet (5 mg total) by mouth daily. At night  . [DISCONTINUED] rosuvastatin (CRESTOR) 5 MG tablet Take 1 tablet (5 mg total) by mouth daily. At night   No Known Allergies Recent Results (from the past 2160 hour(s))  POCT urine pregnancy     Status: None   Collection Time: 03/23/19 11:59 AM  Result Value Ref  Range   Preg Test, Ur Negative Negative  Hemoglobin A1c     Status: None   Collection Time: 04/21/19  9:36 AM  Result Value Ref Range   Hgb A1c MFr Bld 6.2 4.6 - 6.5 %    Comment: Glycemic Control Guidelines for People with Diabetes:Non Diabetic:  <6%Goal of Therapy: <7%Additional Action Suggested:  >8%   Vitamin D (25 hydroxy)     Status: None   Collection Time: 04/21/19  9:36 AM  Result Value Ref Range   VITD 31.81 30.00 - 100.00 ng/mL  Urinalysis, Routine w reflex microscopic     Status: Abnormal   Collection Time: 04/21/19  9:36 AM  Result Value Ref Range   Color, Urine YELLOW Yellow;Lt. Yellow;Straw;Dark Yellow;Amber;Green;Red;Brown   APPearance CLEAR Clear;Turbid;Slightly Cloudy;Cloudy   Specific Gravity, Urine <=1.005 (A) 1.000 - 1.030   pH 6.0 5.0 - 8.0   Total Protein, Urine NEGATIVE Negative   Urine Glucose NEGATIVE  Negative   Ketones, ur NEGATIVE Negative   Bilirubin Urine NEGATIVE Negative   Hgb urine dipstick NEGATIVE Negative   Urobilinogen, UA 0.2 0.0 - 1.0   Leukocytes,Ua SMALL (A) Negative   Nitrite NEGATIVE Negative   WBC, UA 3-6/hpf (A) 0-2/hpf   RBC / HPF none seen 0-2/hpf   Squamous Epithelial / LPF Rare(0-4/hpf) Rare(0-4/hpf)  T4, free     Status: None   Collection Time: 04/21/19  9:36 AM  Result Value Ref Range   Free T4 0.92 0.60 - 1.60 ng/dL    Comment: Specimens from patients who are undergoing biotin therapy and /or ingesting biotin supplements may contain high levels of biotin.  The higher biotin concentration in these specimens interferes with this Free T4 assay.  Specimens that contain high levels  of biotin may cause false high results for this Free T4 assay.  Please interpret results in light of the total clinical presentation of the patient.    TSH     Status: None   Collection Time: 04/21/19  9:36 AM  Result Value Ref Range   TSH 0.90 0.35 - 4.50 uIU/mL  Lipid panel     Status: None   Collection Time: 04/21/19  9:36 AM  Result Value Ref Range   Cholesterol 165 0 - 200 mg/dL    Comment: ATP III Classification       Desirable:  < 200 mg/dL               Borderline High:  200 - 239 mg/dL          High:  > = 010 mg/dL   Triglycerides 27.2 0.0 - 149.0 mg/dL    Comment: Normal:  <536 mg/dLBorderline High:  150 - 199 mg/dL   HDL 64.40 >34.74 mg/dL   VLDL 25.9 0.0 - 56.3 mg/dL   LDL Cholesterol 74 0 - 99 mg/dL   Total CHOL/HDL Ratio 2     Comment:                Men          Women1/2 Average Risk     3.4          3.3Average Risk          5.0          4.42X Average Risk          9.6          7.13X Average Risk          15.0  11.0                       NonHDL 91.15     Comment: NOTE:  Non-HDL goal should be 30 mg/dL higher than patient's LDL goal (i.e. LDL goal of < 70 mg/dL, would have non-HDL goal of < 100 mg/dL)  CBC w/Diff     Status: None   Collection Time: 04/21/19   9:36 AM  Result Value Ref Range   WBC 6.4 4.0 - 10.5 K/uL   RBC 4.33 3.87 - 5.11 Mil/uL   Hemoglobin 12.9 12.0 - 15.0 g/dL   HCT 38.8 36.0 - 46.0 %   MCV 89.7 78.0 - 100.0 fl   MCHC 33.2 30.0 - 36.0 g/dL   RDW 13.5 11.5 - 15.5 %   Platelets 151.0 150.0 - 400.0 K/uL   Neutrophils Relative % 64.6 43.0 - 77.0 %   Lymphocytes Relative 29.7 12.0 - 46.0 %   Monocytes Relative 4.4 3.0 - 12.0 %   Eosinophils Relative 0.9 0.0 - 5.0 %   Basophils Relative 0.4 0.0 - 3.0 %   Neutro Abs 4.1 1.4 - 7.7 K/uL   Lymphs Abs 1.9 0.7 - 4.0 K/uL   Monocytes Absolute 0.3 0.1 - 1.0 K/uL   Eosinophils Absolute 0.1 0.0 - 0.7 K/uL   Basophils Absolute 0.0 0.0 - 0.1 K/uL  Comprehensive metabolic panel     Status: Abnormal   Collection Time: 04/21/19  9:36 AM  Result Value Ref Range   Sodium 141 135 - 145 mEq/L   Potassium 3.9 3.5 - 5.1 mEq/L   Chloride 104 96 - 112 mEq/L   CO2 30 19 - 32 mEq/L   Glucose, Bld 98 70 - 99 mg/dL   BUN 8 6 - 23 mg/dL   Creatinine, Ser 0.71 0.40 - 1.20 mg/dL   Total Bilirubin 1.5 (H) 0.2 - 1.2 mg/dL   Alkaline Phosphatase 78 39 - 117 U/L   AST 23 0 - 37 U/L   ALT 25 0 - 35 U/L   Total Protein 7.2 6.0 - 8.3 g/dL   Albumin 4.6 3.5 - 5.2 g/dL   Calcium 10.0 8.4 - 10.5 mg/dL   GFR 83.87 >60.00 mL/min  Bilirubin, Total     Status: None   Collection Time: 04/30/19  2:01 PM  Result Value Ref Range   Total Bilirubin 0.9 0.2 - 1.2 mg/dL  Bilirubin, Direct     Status: None   Collection Time: 04/30/19  2:01 PM  Result Value Ref Range   Bilirubin, Direct 0.1 0.0 - 0.3 mg/dL   Objective  Body mass index is 19.83 kg/m. Wt Readings from Last 3 Encounters:  05/18/19 108 lb 6.4 oz (49.2 kg)  04/30/19 107 lb 9.6 oz (48.8 kg)  04/01/19 107 lb (48.5 kg)   Temp Readings from Last 3 Encounters:  05/18/19 (!) 97.5 F (36.4 C) (Oral)  04/30/19 98.5 F (36.9 C) (Oral)  03/23/19 97.9 F (36.6 C) (Temporal)   BP Readings from Last 3 Encounters:  05/18/19 122/68  04/30/19 132/70   04/01/19 100/70   Pulse Readings from Last 3 Encounters:  05/18/19 75  04/30/19 87  03/23/19 83    Physical Exam Vitals signs and nursing note reviewed.  Constitutional:      Appearance: Normal appearance. She is well-developed and well-groomed.     Comments: +mask on    HENT:     Head: Normocephalic and atraumatic.  Eyes:     Conjunctiva/sclera:  Conjunctivae normal.     Pupils: Pupils are equal, round, and reactive to light.  Cardiovascular:     Rate and Rhythm: Normal rate and regular rhythm.     Heart sounds: Normal heart sounds. No murmur.  Pulmonary:     Effort: Pulmonary effort is normal.     Breath sounds: Normal breath sounds.  Skin:    General: Skin is warm and dry.  Neurological:     General: No focal deficit present.     Mental Status: She is alert and oriented to person, place, and time. Mental status is at baseline.     Gait: Gait normal.  Psychiatric:        Attention and Perception: Attention and perception normal.        Mood and Affect: Mood and affect normal.        Speech: Speech normal.        Behavior: Behavior normal. Behavior is cooperative.        Thought Content: Thought content normal.        Cognition and Memory: Cognition and memory normal.        Judgment: Judgment normal.     Assessment  Plan  Osteoporosis with current pathological fracture with routine healing, unspecified osteoporosis type, subsequent encounter - Plan: alendronate (FOSAMAX) 70 MG tablet Q 7 days, DG Bone Density repeat in 1 year   Hyperlipidemia, unspecified hyperlipidemia type - Plan: rosuvastatin (CRESTOR) 5 MG tablet ok to reduce dose  Screening mammogram, encounter for - Plan: MM 3D SCREEN BREAST BILATERAL due 05/06/20   Had flu shott 04/25/19 at work  Consider shingrix in future Tdap and hep B titer pt to check at employee health [facility] dates Pt thinks < 10 years as of 04/30/19  Hep C negative  mammosch 05/07/19 Will order DEXA 05/07/2019 as well  Given  info on cologaurd pt to call and check price with insurance never had colonoscopyand declines pt consider cologuard again today 04/30/19  Pap had 06/22/15 neg pap neg HPVoverdue  Wanted to do today sacral fractures preventing will do in 3-6 months  rec D3 4000 IU qd and calcium 600 mg qd     Provider: Dr. French Ana McLean-Scocuzza-Internal Medicine

## 2019-05-18 NOTE — Patient Instructions (Signed)
Calcium at least 600 mg daily  Vitamin D3 4000 IU dailiy      Calcium Content in Foods Calcium is the most abundant mineral in your body. Most of your body's calcium supply is stored in your bones and teeth. Calcium helps many parts of the body function normally, including:  Blood and blood vessels.  Nerves.  Hormones.  Muscles.  Bones and teeth. When your calcium stores are low, you may be at risk for low bone mass, bone loss, and broken bones (fractures). When you get enough calcium, it helps to support strong bones and teeth throughout your life. Calcium is especially important for:  Children during growth spurts.  Girls during adolescence.  Women who are pregnant or breastfeeding.  Women after their menstrual cycle stops (postmenopause).  Women whose menstrual cycle has stopped due to anorexia nervosa or regular intense exercise.  People who cannot eat or digest dairy products.  Vegans. What are tips for getting more calcium? General information  Try to get most of your calcium from food. Eat foods that are high in calcium.  Some people may benefit from taking calcium supplements. Check with your health care provider or diet and nutrition specialist (dietitian) before starting any calcium supplements. Calcium supplements may interact with certain medicines. Too much calcium may cause other health problems, like constipation and kidney stones.  For the body to absorb calcium, it needs vitamin D. Sources of vitamin D include: ? Skin exposure to direct sunlight. ? Foods, such as egg yolks, liver, saltwater fish, and fortified milk. ? Vitamin D supplements. Check with your health care provider or dietitian before starting any vitamin D supplements. What foods are high in calcium?  High-calcium foods are those that contain more than 100 milligrams (mg) of calcium per serving. Fruits  Fortified orange or other fruit juice, 300 mg per 8 oz serving. Vegetables   Collard greens, 360 mg per 8 oz serving.  Kale, 180 mg per 8 oz serving.  Bok choy, 160 mg per 8 oz serving. Grains  Fortified ready-to-eat cereals, 100-1,000 mg per 8 oz serving.  Fortified frozen waffles, 200 mg in two waffles. Meats and other proteins  Sardines, canned with bones, 325 mg per 3 oz serving.  Salmon, canned with bones, 180 mg per 3 oz serving.  Canned shrimp, 125 mg per 3 oz serving.  Baked beans, 160 mg per 4 oz serving. Dairy  Yogurt, plain, low-fat, 310 mg per 6 oz serving.  Milk, 300 mg per 8 oz serving.  American cheese, 195 mg per 1 oz serving.  Cheddar cheese, 205 mg per 1 oz serving.  Cottage cheese 2%, 105 mg per 4 oz serving.  Fortified soy, rice, or almond milk, 300 mg per 8 oz serving. The items listed above may not be a complete list of foods high in calcium. Actual amounts of calcium may be different depending on processing. Contact a dietitian for more information. What foods are lower in calcium? Foods lower in calcium are those that contain 50 mg of calcium or less per serving. Fruits  Apple, about 6 mg in one apple.  Banana, about 12 mg in one banana. Vegetables  Lettuce, 19 mg per 2 oz serving.  Tomato, about 11 mg in one tomato. Grains  Rice, 4 mg per 6 oz serving.  Boiled potatoes, 14 mg per 8 oz serving.  White bread, 6 mg in one slice. Meats and other proteins  Egg, 27 mg per 2 oz serving.  Red meat,  7 mg per 4 oz serving.  Chicken, 17 mg per 4 oz serving.  Fish, cod or trout, 20 mg per 4 oz serving. The items listed above may not be a complete list of foods lower in calcium. Actual amounts of calcium may be different depending on processing. Contact a dietitian for more information. Summary  Calcium is an important mineral in the body because it affects many functions. Getting enough calcium helps support strong bones and teeth throughout your life.  Try to get most of your calcium from food.  Calcium  supplements may interact with certain medicines. Check with your health care provider before starting any calcium supplements. This information is not intended to replace advice given to you by your health care provider. Make sure you discuss any questions you have with your health care provider. Document Released: 03/05/2004 Document Revised: 07/15/2017 Document Reviewed: 07/15/2017 Elsevier Patient Education  2020 [employer].  Osteoporosis  Osteoporosis is thinning and loss of density in your bones. Osteoporosis makes bones more brittle and fragile and more likely to break (fracture). Over time, osteoporosis can cause your bones to become so weak that they fracture after a minor fall. Bones in the hip, wrist, and spine are most likely to fracture due to osteoporosis. What are the causes? The exact cause of this condition is not known. What increases the risk? You may be at greater risk for osteoporosis if you:  Have a family history of the condition.  Have poor nutrition.  Use steroid medicines, such as prednisone.  Are female.  Are age 35 or older.  Smoke or have a history of smoking.  Are not physically active (are sedentary).  Are white (Caucasian) or of Asian descent.  Have a small body frame.  Take certain medicines, such as antiseizure medicines. What are the signs or symptoms? A fracture might be the first sign of osteoporosis, especially if the fracture results from a fall or injury that usually would not cause a bone to break. Other signs and symptoms include:  Pain in the neck or low back.  Stooped posture.  Loss of height. How is this diagnosed? This condition may be diagnosed based on:  Your medical history.  A physical exam.  A bone mineral density test, also called a DXA or DEXA test (dual-energy X-ray absorptiometry test). This test uses X-rays to measure the amount of minerals in your bones. How is this treated? The goal of treatment is to  strengthen your bones and lower your risk for a fracture. Treatment may involve:  Making lifestyle changes, such as: ? Including foods with more calcium and vitamin D in your diet. ? Doing weight-bearing and muscle-strengthening exercises. ? Stopping tobacco use. ? Limiting alcohol intake.  Taking medicine to slow the process of bone loss or to increase bone density.  Taking daily supplements of calcium and vitamin D.  Taking hormone replacement medicines, such as estrogen for women and testosterone for men.  Monitoring your levels of calcium and vitamin D. Follow these instructions at home:  Activity  Exercise as told by your health care provider. Ask your health care provider what exercises and activities are safe for you. You should do: ? Exercises that make you work against gravity (weight-bearing exercises), such as tai chi, yoga, or walking. ? Exercises to strengthen muscles, such as lifting weights. Lifestyle  Limit alcohol intake to no more than 1 drink a day for nonpregnant women and 2 drinks a day for men. One drink equals 12  oz of beer, 5 oz of wine, or 1 oz of hard liquor.  Do not use any products that contain nicotine or tobacco, such as cigarettes and e-cigarettes. If you need help quitting, ask your health care provider. Preventing falls  Use devices to help you move around (mobility aids) as needed, such as canes, walkers, scooters, or crutches.  Keep rooms well-lit and clutter-free.  Remove tripping hazards from walkways, including cords and throw rugs.  Install grab bars in bathrooms and safety rails on stairs.  Use rubber mats in the bathroom and other areas that are often wet or slippery.  Wear closed-toe shoes that fit well and support your feet. Wear shoes that have rubber soles or low heels.  Review your medicines with your health care provider. Some medicines can cause dizziness or changes in blood pressure, which can increase your risk of falling.  General instructions  Include calcium and vitamin D in your diet. Calcium is important for bone health, and vitamin D helps your body to absorb calcium. Good sources of calcium and vitamin D include: ? Certain fatty fish, such as salmon and tuna. ? Products that have calcium and vitamin D added to them (fortified products), such as fortified cereals. ? Egg yolks. ? Cheese. ? Liver.  Take over-the-counter and prescription medicines only as told by your health care provider.  Keep all follow-up visits as told by your health care provider. This is important. Contact a health care provider if:  You have never been screened for osteoporosis and you are: ? A woman who is age 73 or older. ? A man who is age 21 or older. Get help right away if:  You fall or injure yourself. Summary  Osteoporosis is thinning and loss of density in your bones. This makes bones more brittle and fragile and more likely to break (fracture),even with minor falls.  The goal of treatment is to strengthen your bones and reduce your risk for a fracture.  Include calcium and vitamin D in your diet. Calcium is important for bone health, and vitamin D helps your body to absorb calcium.  Talk with your health care provider about screening for osteoporosis if you are a woman who is age 32 or older, or a man who is age 3 or older. This information is not intended to replace advice given to you by your health care provider. Make sure you discuss any questions you have with your health care provider. Document Released: 05/01/2005 Document Revised: 07/04/2017 Document Reviewed: 05/16/2017 Elsevier Patient Education  [place].  Denosumab injection What is this medicine? DENOSUMAB (den oh sue mab) slows bone breakdown. Prolia is used to treat osteoporosis in women after menopause and in men, and in people who are taking corticosteroids for 6 months or more. Delton See is used to treat a high calcium level due to cancer  and to prevent bone fractures and other bone problems caused by multiple myeloma or cancer bone metastases. Delton See is also used to treat giant cell tumor of the bone. This medicine may be used for other purposes; ask your health care provider or pharmacist if you have questions. COMMON BRAND NAME(S): Prolia, XGEVA What should I tell my health care provider before I take this medicine? They need to know if you have any of these conditions:  dental disease  having surgery or tooth extraction  infection  kidney disease  low levels of calcium or Vitamin D in the blood  malnutrition  on hemodialysis  skin  conditions or sensitivity  thyroid or parathyroid disease  an unusual reaction to denosumab, other medicines, foods, dyes, or preservatives  pregnant or trying to get pregnant  breast-feeding How should I use this medicine? This medicine is for injection under the skin. It is given by a health care professional in a hospital or clinic setting. A special MedGuide will be given to you before each treatment. Be sure to read this information carefully each time. For Prolia, talk to your pediatrician regarding the use of this medicine in children. Special care may be needed. For Delton See, talk to your pediatrician regarding the use of this medicine in children. While this drug may be prescribed for children as young as 13 years for selected conditions, precautions do apply. Overdosage: If you think you have taken too much of this medicine contact a poison control center or emergency room at once. NOTE: This medicine is only for you. Do not share this medicine with others. What if I miss a dose? It is important not to miss your dose. Call your doctor or health care professional if you are unable to keep an appointment. What may interact with this medicine? Do not take this medicine with any of the following medications:  other medicines containing denosumab This medicine may also interact  with the following medications:  medicines that lower your chance of fighting infection  steroid medicines like prednisone or cortisone This list may not describe all possible interactions. Give your health care provider a list of all the medicines, herbs, non-prescription drugs, or dietary supplements you use. Also tell them if you smoke, drink alcohol, or use illegal drugs. Some items may interact with your medicine. What should I watch for while using this medicine? Visit your doctor or health care professional for regular checks on your progress. Your doctor or health care professional may order blood tests and other tests to see how you are doing. Call your doctor or health care professional for advice if you get a fever, chills or sore throat, or other symptoms of a cold or flu. Do not treat yourself. This drug may decrease your body's ability to fight infection. Try to avoid being around people who are sick. You should make sure you get enough calcium and vitamin D while you are taking this medicine, unless your doctor tells you not to. Discuss the foods you eat and the vitamins you take with your health care professional. See your dentist regularly. Brush and floss your teeth as directed. Before you have any dental work done, tell your dentist you are receiving this medicine. Do not become pregnant while taking this medicine or for 5 months after stopping it. Talk with your doctor or health care professional about your birth control options while taking this medicine. Women should inform their doctor if they wish to become pregnant or think they might be pregnant. There is a potential for serious side effects to an unborn child. Talk to your health care professional or pharmacist for more information. What side effects may I notice from receiving this medicine? Side effects that you should report to your doctor or health care professional as soon as possible:  allergic reactions like skin  rash, itching or hives, swelling of the face, lips, or tongue  bone pain  breathing problems  dizziness  jaw pain, especially after dental work  redness, blistering, peeling of the skin  signs and symptoms of infection like fever or chills; cough; sore throat; pain or trouble passing urine  signs of low calcium like fast heartbeat, muscle cramps or muscle pain; pain, tingling, numbness in the hands or feet; seizures  unusual bleeding or bruising  unusually weak or tired Side effects that usually do not require medical attention (report to your doctor or health care professional if they continue or are bothersome):  constipation  diarrhea  headache  joint pain  loss of appetite  muscle pain  runny nose  tiredness  upset stomach This list may not describe all possible side effects. Call your doctor for medical advice about side effects. You may report side effects to FDA at 1-800-FDA-1088. Where should I keep my medicine? This medicine is only given in a clinic, doctor's office, or other health care setting and will not be stored at home. NOTE: This sheet is a summary. It may not cover all possible information. If you have questions about this medicine, talk to your doctor, pharmacist, or health care provider.  2020 Elsevier/Gold Standard (2017-11-28 16:10:44)  Alendronate tablets Fosamax  What is this medicine? ALENDRONATE (a LEN droe nate) slows calcium loss from bones. It helps to make normal healthy bone and to slow bone loss in people with Paget's disease and osteoporosis. It may be used in others at risk for bone loss. This medicine may be used for other purposes; ask your health care provider or pharmacist if you have questions. COMMON BRAND NAME(S): Fosamax What should I tell my health care provider before I take this medicine? They need to know if you have any of these conditions:  dental disease  esophagus, stomach, or intestine problems, like acid  reflux or GERD  kidney disease  low blood calcium  low vitamin D  problems sitting or standing 30 minutes  trouble swallowing  an unusual or allergic reaction to alendronate, other medicines, foods, dyes, or preservatives  pregnant or trying to get pregnant  breast-feeding How should I use this medicine? You must take this medicine exactly as directed or you will lower the amount of the medicine you absorb into your body or you may cause yourself harm. Take this medicine by mouth first thing in the morning, after you are up for the day. Do not eat or drink anything before you take your medicine. Swallow the tablet with a full glass (6 to 8 fluid ounces) of plain water. Do not take this medicine with any other drink. Do not chew or crush the tablet. After taking this medicine, do not eat breakfast, drink, or take any medicines or vitamins for at least 30 minutes. Sit or stand up for at least 30 minutes after you take this medicine; do not lie down. Do not take your medicine more often than directed. Talk to your pediatrician regarding the use of this medicine in children. Special care may be needed. Overdosage: If you think you have taken too much of this medicine contact a poison control center or emergency room at once. NOTE: This medicine is only for you. Do not share this medicine with others. What if I miss a dose? If you miss a dose, do not take it later in the day. Continue your normal schedule starting the next morning. Do not take double or extra doses. What may interact with this medicine?  aluminum hydroxide  antacids  aspirin  calcium supplements  drugs for inflammation like ibuprofen, naproxen, and others  iron supplements  magnesium supplements  vitamins with minerals This list may not describe all possible interactions. Give your health care provider a list  of all the medicines, herbs, non-prescription drugs, or dietary supplements you use. Also tell them if you  smoke, drink alcohol, or use illegal drugs. Some items may interact with your medicine. What should I watch for while using this medicine? Visit your doctor or health care professional for regular checks ups. It may be some time before you see benefit from this medicine. Do not stop taking your medicine except on your doctor's advice. Your doctor or health care professional may order blood tests and other tests to see how you are doing. You should make sure you get enough calcium and vitamin D while you are taking this medicine, unless your doctor tells you not to. Discuss the foods you eat and the vitamins you take with your health care professional. Some people who take this medicine have severe bone, joint, and/or muscle pain. This medicine may also increase your risk for a broken thigh bone. Tell your doctor right away if you have pain in your upper leg or groin. Tell your doctor if you have any pain that does not go away or that gets worse. This medicine can make you more sensitive to the sun. If you get a rash while taking this medicine, sunlight may cause the rash to get worse. Keep out of the sun. If you cannot avoid being in the sun, wear protective clothing and use sunscreen. Do not use sun lamps or tanning beds/booths. What side effects may I notice from receiving this medicine? Side effects that you should report to your doctor or health care professional as soon as possible:  allergic reactions like skin rash, itching or hives, swelling of the face, lips, or tongue  black or tarry stools  bone, muscle or joint pain  changes in vision  chest pain  heartburn or stomach pain  jaw pain, especially after dental work  pain or trouble when swallowing  redness, blistering, peeling or loosening of the skin, including inside the mouth Side effects that usually do not require medical attention (report to your doctor or health care professional if they continue or are bothersome):   changes in taste  diarrhea or constipation  eye pain or itching  headache  nausea or vomiting  stomach gas or fullness This list may not describe all possible side effects. Call your doctor for medical advice about side effects. You may report side effects to FDA at 1-800-FDA-1088. Where should I keep my medicine? Keep out of the reach of children. Store at room temperature of 15 and 30 degrees C (59 and 86 degrees F). Throw away any unused medicine after the expiration date. NOTE: This sheet is a summary. It may not cover all possible information. If you have questions about this medicine, talk to your doctor, pharmacist, or health care provider.  2020 Elsevier/Gold Standard (2011-01-18 08:56:09)

## 2019-05-31 ENCOUNTER — Encounter: Payer: Self-pay | Admitting: Internal Medicine

## 2019-06-01 ENCOUNTER — Encounter: Payer: Self-pay | Admitting: Internal Medicine

## 2019-08-04 ENCOUNTER — Ambulatory Visit: Payer: 59 | Admitting: Internal Medicine

## 2020-05-02 IMAGING — MG MM DIGITAL SCREENING BILAT W/ TOMO W/ CAD
8 series · 9 of 24 positions shown · non-contrast
Comparison: Previous exam(s).

CLINICAL DATA: Screening.

EXAM:
DIGITAL SCREENING BILATERAL MAMMOGRAM WITH TOMO AND CAD

[R CC synth-2D]
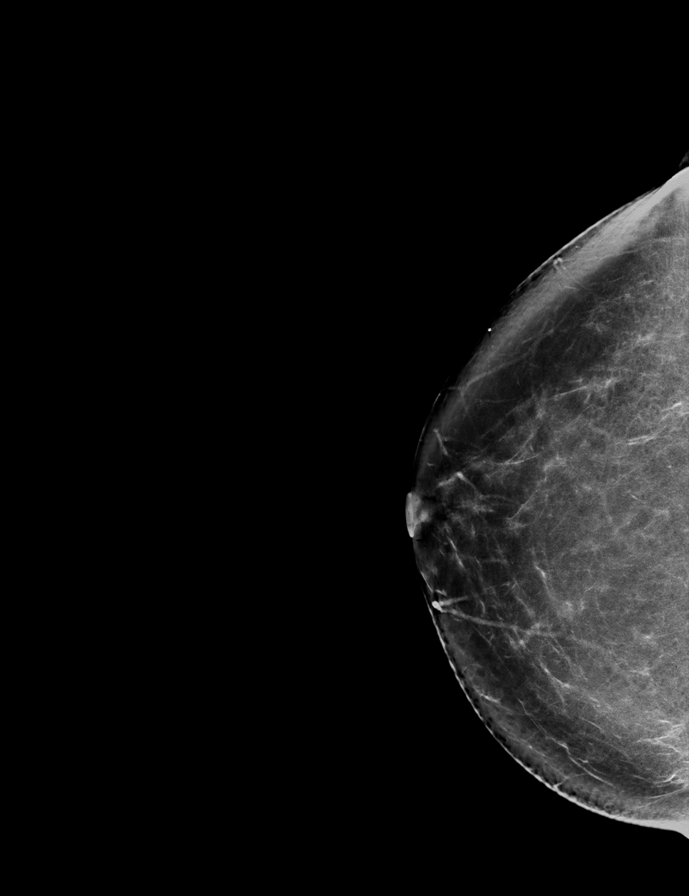

[L MLO synth-2D]
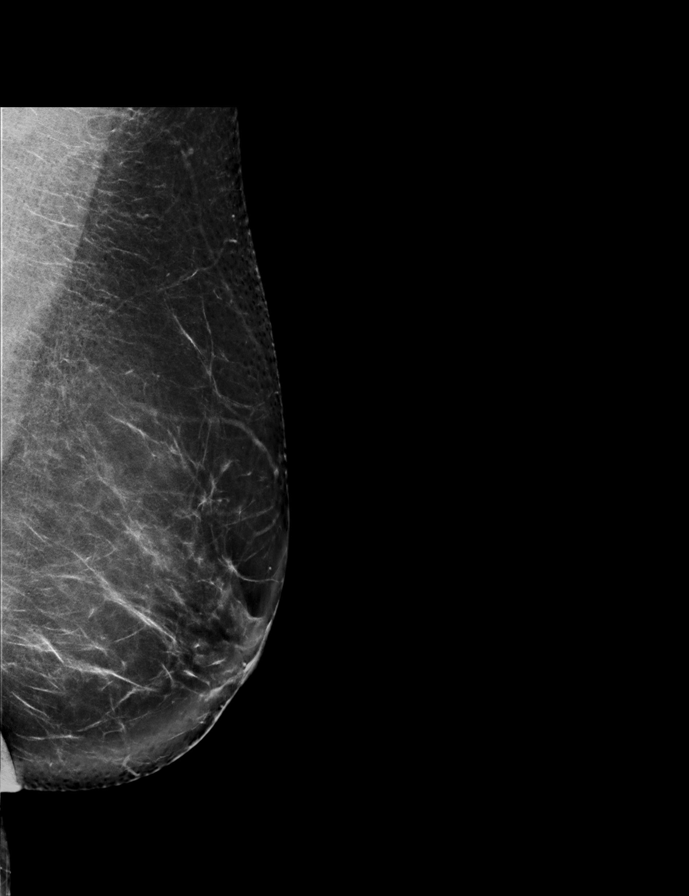

[R MLO synth-2D]
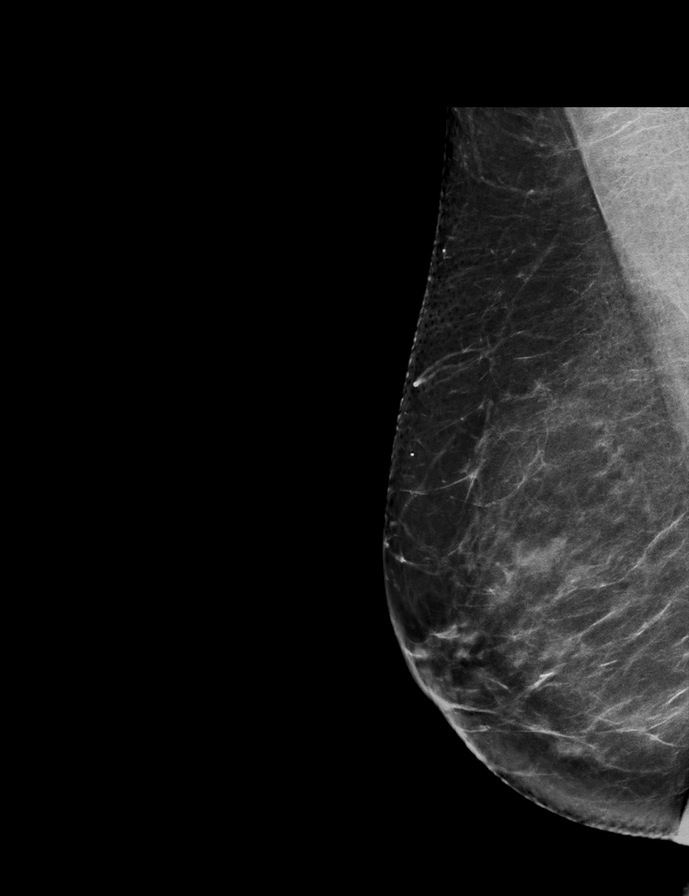

[L CC synth-2D]
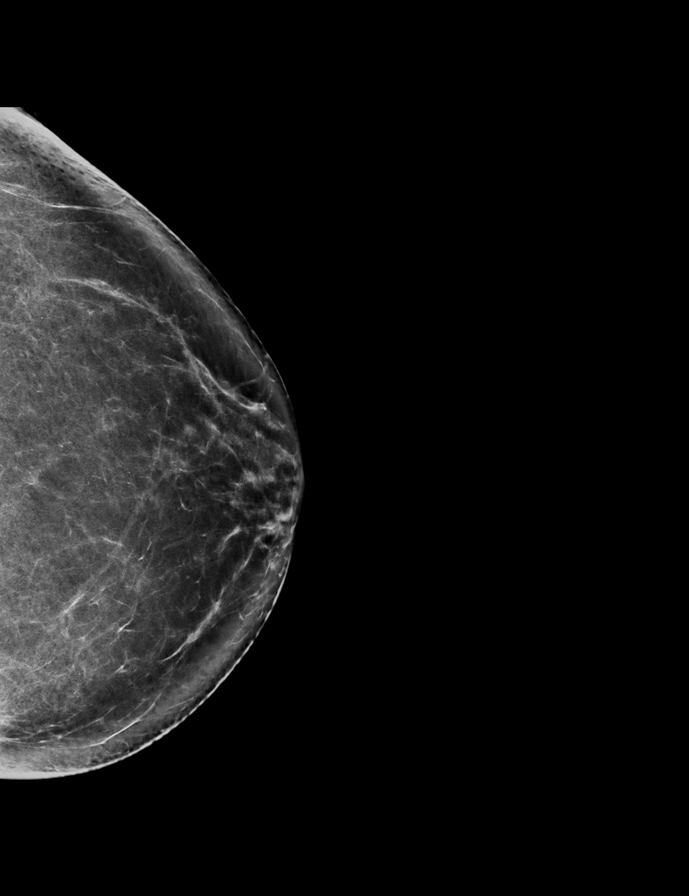

[R MLO tomo · 2 of 85 frames shown]
[frame 28/85]
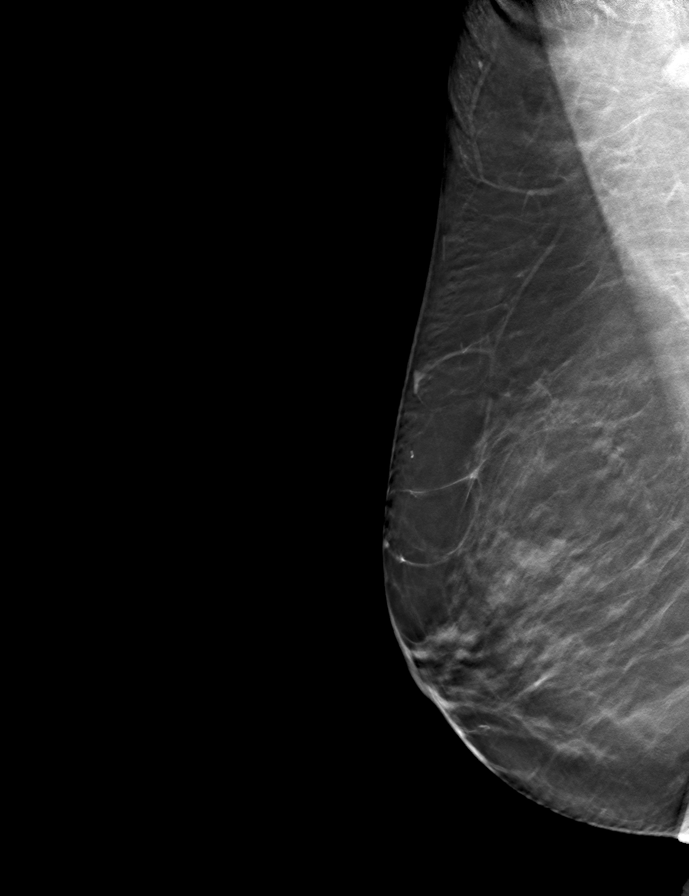
[frame 43/85]
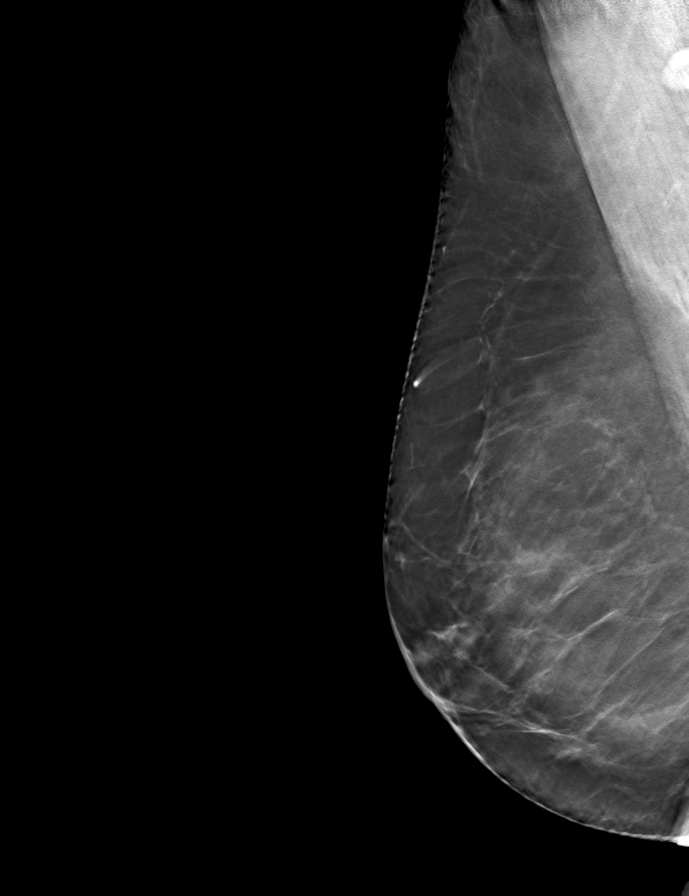

[L CC tomo · tomo slice 39/77.0]
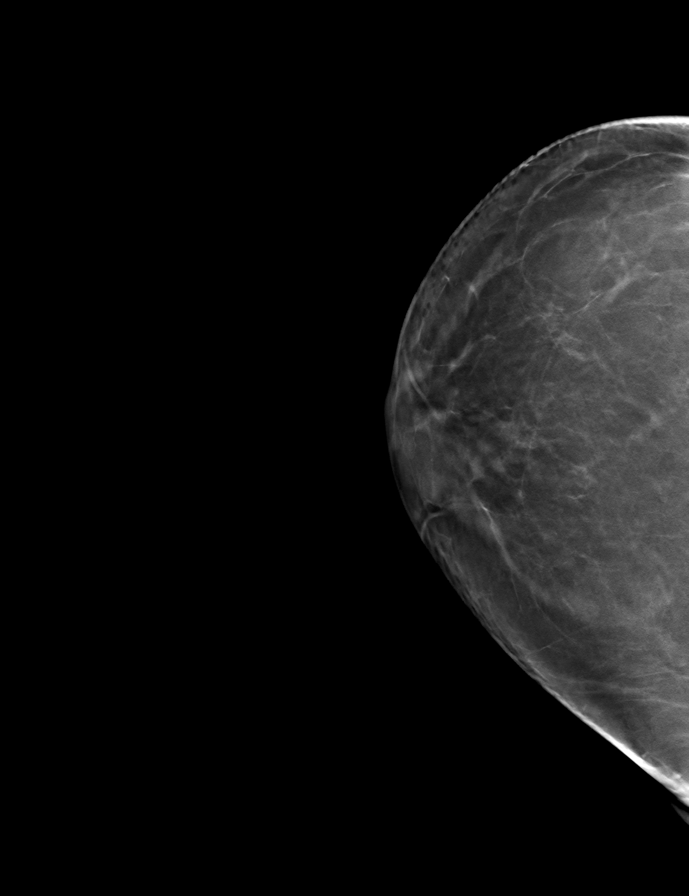

[R CC tomo · tomo slice 45/88.0]
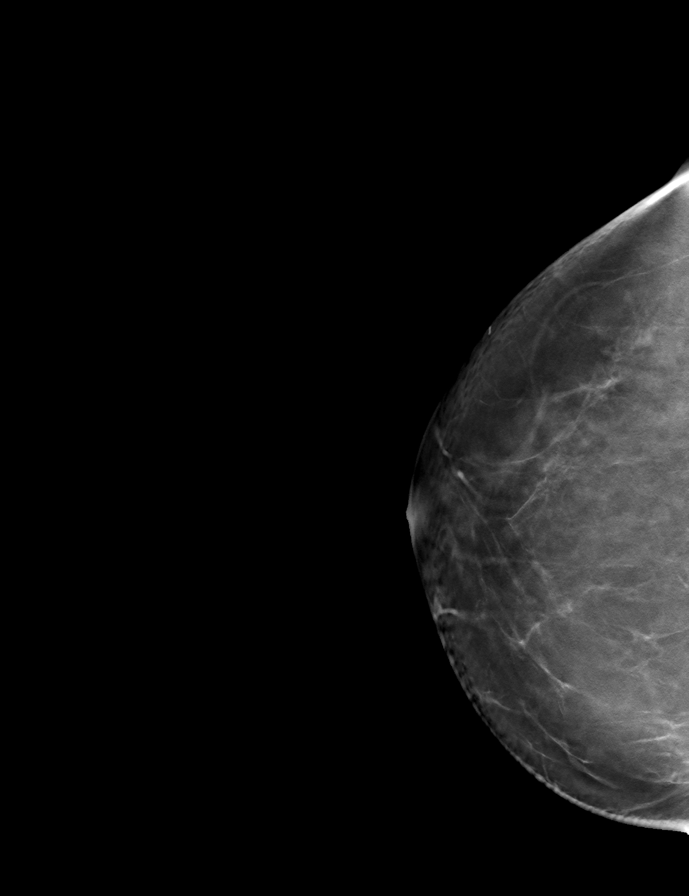

[L MLO tomo · tomo slice 45/88.0]
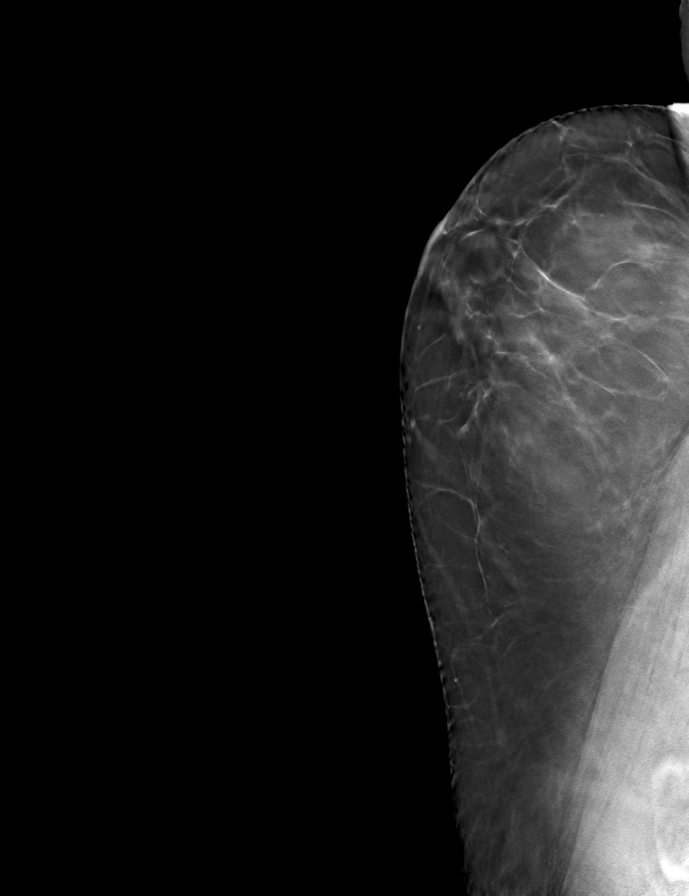

[9 of 24 positions shown; findings below may reference images not displayed]

ACR Breast Density Category b: There are scattered areas of
fibroglandular density.
FINDINGS: There are no findings suspicious for malignancy. Images were
processed with CAD.
IMPRESSION: No mammographic evidence of malignancy. A result letter of this
screening mammogram will be mailed directly to the patient.

RECOMMENDATION:
Screening mammogram in one year. (Code:[id number])

BI-RADS CATEGORY  1: Negative.

## 2020-05-25 ENCOUNTER — Encounter: Payer: 59 | Admitting: Internal Medicine

## 2020-06-15 DIAGNOSIS — H40023 Open angle with borderline findings, high risk, bilateral: Secondary | ICD-10-CM | POA: Diagnosis not present

## 2020-06-15 DIAGNOSIS — E7800 Pure hypercholesterolemia, unspecified: Secondary | ICD-10-CM | POA: Diagnosis not present

## 2020-06-15 DIAGNOSIS — H524 Presbyopia: Secondary | ICD-10-CM | POA: Diagnosis not present

## 2020-06-15 DIAGNOSIS — H25813 Combined forms of age-related cataract, bilateral: Secondary | ICD-10-CM | POA: Diagnosis not present

## 2020-06-15 DIAGNOSIS — H18413 Arcus senilis, bilateral: Secondary | ICD-10-CM | POA: Diagnosis not present

## 2020-06-21 ENCOUNTER — Other Ambulatory Visit: Payer: Self-pay

## 2020-06-23 ENCOUNTER — Encounter: Payer: Self-pay | Admitting: Internal Medicine

## 2020-06-23 ENCOUNTER — Other Ambulatory Visit: Payer: Self-pay

## 2020-06-23 ENCOUNTER — Other Ambulatory Visit: Payer: Self-pay | Admitting: Internal Medicine

## 2020-06-23 ENCOUNTER — Telehealth: Payer: Self-pay | Admitting: Internal Medicine

## 2020-06-23 ENCOUNTER — Ambulatory Visit (INDEPENDENT_AMBULATORY_CARE_PROVIDER_SITE_OTHER): Payer: 59 | Admitting: Internal Medicine

## 2020-06-23 VITALS — BP 122/82 | HR 103 | Temp 98.2°F | Ht 59.84 in | Wt 94.8 lb

## 2020-06-23 DIAGNOSIS — E559 Vitamin D deficiency, unspecified: Secondary | ICD-10-CM

## 2020-06-23 DIAGNOSIS — Z1329 Encounter for screening for other suspected endocrine disorder: Secondary | ICD-10-CM | POA: Diagnosis not present

## 2020-06-23 DIAGNOSIS — E785 Hyperlipidemia, unspecified: Secondary | ICD-10-CM | POA: Diagnosis not present

## 2020-06-23 DIAGNOSIS — Z0184 Encounter for antibody response examination: Secondary | ICD-10-CM | POA: Diagnosis not present

## 2020-06-23 DIAGNOSIS — Z13220 Encounter for screening for lipoid disorders: Secondary | ICD-10-CM | POA: Diagnosis not present

## 2020-06-23 DIAGNOSIS — Z0000 Encounter for general adult medical examination without abnormal findings: Secondary | ICD-10-CM

## 2020-06-23 DIAGNOSIS — Z1389 Encounter for screening for other disorder: Secondary | ICD-10-CM

## 2020-06-23 MED ORDER — ROSUVASTATIN CALCIUM 5 MG PO TABS: 5.0000 mg | ORAL_TABLET | Freq: Every day | ORAL | 3 refills | Status: DC

## 2020-06-23 NOTE — Progress Notes (Signed)
Chief Complaint  Patient presents with  . Annual Exam   Annual  declines pap today and wants to wait until age 59 as well as mammogram due in 2022  hld on crestor 5 mg qs and needs refill  Thinking about cologuard Osteoporosis could not tolerate fosamax 2/2 nausea    Review of Systems  Constitutional: Positive for weight loss.       +14 down  HENT: Negative for hearing loss.   Eyes: Negative for blurred vision.  Respiratory: Negative for shortness of breath.   Cardiovascular: Negative for chest pain.  Gastrointestinal: Negative for abdominal pain and blood in stool.  Musculoskeletal: Negative for joint pain.  Skin: Negative for rash.  Neurological: Negative for headaches.  Psychiatric/Behavioral: Negative for depression.   Past Medical History:  Diagnosis Date  . Ankle pain, chronic 06/22/2015  . Hyperlipidemia 07/05/2016   Past Surgical History:  Procedure Laterality Date  . APPENDECTOMY  1988   Family History  Problem Relation Age of Onset  . Heart disease Mother   . Heart disease Father   . Stroke Paternal Grandfather   . Hypertension Paternal Grandfather   . Diabetes Paternal Grandfather   . Breast cancer Neg Hx    Social History   Socioeconomic History  . Marital status: Married    Spouse name: Not on file  . Number of children: Not on file  . Years of education: Not on file  . Highest education level: Not on file  Occupational History  . Not on file  Tobacco Use  . Smoking status: Never Smoker  . Smokeless tobacco: Never Used  Substance and Sexual Activity  . Alcohol use: No  . Drug use: No  . Sexual activity: Not on file  Other Topics Concern  . Not on file  Social History Narrative   Married    Kids    From [place] family still there    Nurse in ICU   Social Determinants of Health   Financial Resource Strain:   . Difficulty of Paying Living Expenses: Not on file  Food Insecurity:   . Worried About Programme researcher, broadcasting/film/video in the Last Year: Not  on file  . Ran Out of Food in the Last Year: Not on file  Transportation Needs:   . Lack of Transportation (Medical): Not on file  . Lack of Transportation (Non-Medical): Not on file  Physical Activity:   . Days of Exercise per Week: Not on file  . Minutes of Exercise per Session: Not on file  Stress:   . Feeling of Stress : Not on file  Social Connections:   . Frequency of Communication with Friends and Family: Not on file  . Frequency of Social Gatherings with Friends and Family: Not on file  . Attends Religious Services: Not on file  . Active Member of Clubs or Organizations: Not on file  . Attends Banker Meetings: Not on file  . Marital Status: Not on file  Intimate Partner Violence:   . Fear of Current or Ex-Partner: Not on file  . Emotionally Abused: Not on file  . Physically Abused: Not on file  . Sexually Abused: Not on file   Current Meds  Medication Sig  . rosuvastatin (CRESTOR) 5 MG tablet Take 1 tablet (5 mg total) by mouth daily. At night  . [DISCONTINUED] alendronate (FOSAMAX) 70 MG tablet Take 1 tablet (70 mg total) by mouth every 7 (seven) days. Take with a full glass of water on  an empty stomach.  . [DISCONTINUED] Cholecalciferol 50000 units capsule Take 1 capsule (50,000 Units total) by mouth once a week.  . [DISCONTINUED] cyclobenzaprine (FLEXERIL) 5 MG tablet Take 1-2 tablets (5-10 mg total) by mouth at bedtime as needed for muscle spasms.  . [DISCONTINUED] rosuvastatin (CRESTOR) 5 MG tablet Take 1 tablet (5 mg total) by mouth daily. At night   Allergies  Allergen Reactions  . Fosamax [Alendronate]     Nausea    No results found for this or any previous visit (from the past 2160 hour(s)). Objective  Body mass index is 18.61 kg/m. Wt Readings from Last 3 Encounters:  06/23/20 94 lb 12.8 oz (43 kg)  05/18/19 108 lb 6.4 oz (49.2 kg)  04/30/19 107 lb 9.6 oz (48.8 kg)   Temp Readings from Last 3 Encounters:  06/23/20 98.2 F (36.8 C) (Oral)   05/18/19 (!) 97.5 F (36.4 C) (Oral)  04/30/19 98.5 F (36.9 C) (Oral)   BP Readings from Last 3 Encounters:  06/23/20 122/82  05/18/19 122/68  04/30/19 132/70   Pulse Readings from Last 3 Encounters:  06/23/20 (!) 103  05/18/19 75  04/30/19 87    Physical Exam Vitals and nursing note reviewed.  Constitutional:      Appearance: Normal appearance. She is well-developed and well-groomed.  HENT:     Head: Normocephalic and atraumatic.  Eyes:     Conjunctiva/sclera: Conjunctivae normal.     Pupils: Pupils are equal, round, and reactive to light.  Cardiovascular:     Rate and Rhythm: Normal rate and regular rhythm.     Heart sounds: Normal heart sounds. No murmur heard.   Pulmonary:     Effort: Pulmonary effort is normal.     Breath sounds: Normal breath sounds.  Abdominal:     General: Abdomen is flat. Bowel sounds are normal.     Tenderness: There is no abdominal tenderness.  Skin:    General: Skin is warm and dry.  Neurological:     General: No focal deficit present.     Mental Status: She is alert and oriented to person, place, and time. Mental status is at baseline.     Gait: Gait normal.  Psychiatric:        Attention and Perception: Attention and perception normal.        Mood and Affect: Mood and affect normal.        Speech: Speech normal.        Behavior: Behavior normal. Behavior is cooperative.        Thought Content: Thought content normal.        Cognition and Memory: Cognition and memory normal.        Judgment: Judgment normal.     Assessment  Plan  Annual physical exam -  Had flu shotutd  Pfizer 2/2 consider booster pt will get this Consider shingrix in futuredisc today 06/23/20   Tdap and hep B titer pt to check at employee health [facility] dates Pt thinks < 10 years as of 04/30/19  Hep C negative  mammosch 10/2/20negative pt wants 05/06/21 but will wt loss 14 lbs will have pt reassess this   +osteoporois DEXA 05/07/2019 fosamax caused  nausea consider prolia   Given info on cologaurd pt to call and check price with insurance never had colonoscopyand declines pt consider cologuard againtoday 06/23/20 check cost with insurance   Pap had 06/22/15 neg pap neg HPVoverdue  Wants to wait until 59 as of 06/23/20   rec  D3 4000 IU qd and calcium 600 mg qd   rec healthy diet and exercise   Hyperlipidemia, unspecified hyperlipidemia type - Plan: Comprehensive metabolic panel, Lipid panel, rosuvastatin (CRESTOR) 5 MG tablet  Provider: Dr. French Ana McLean-Scocuzza-Internal Medicine

## 2020-06-23 NOTE — Patient Instructions (Addendum)
Calcium 600 mg 1-2 x per day  (1200 total)  Vitamin D3 4000 IU   Consider pfizer vaccine booster   Consider shingrix 2 dose  2nd dose 2 to less than 6 months of the 1st   Cost of cologuard CPT code [phone] figure out cost    Denosumab injection What is this medicine? DENOSUMAB (den oh sue mab) slows bone breakdown. Prolia is used to treat osteoporosis in women after menopause and in men, and in people who are taking corticosteroids for 6 months or more. Delton See is used to treat a high calcium level due to cancer and to prevent bone fractures and other bone problems caused by multiple myeloma or cancer bone metastases. Delton See is also used to treat giant cell tumor of the bone. This medicine may be used for other purposes; ask your health care provider or pharmacist if you have questions. COMMON BRAND NAME(S): Prolia, XGEVA What should I tell my health care provider before I take this medicine? They need to know if you have any of these conditions:  dental disease  having surgery or tooth extraction  infection  kidney disease  low levels of calcium or Vitamin D in the blood  malnutrition  on hemodialysis  skin conditions or sensitivity  thyroid or parathyroid disease  an unusual reaction to denosumab, other medicines, foods, dyes, or preservatives  pregnant or trying to get pregnant  breast-feeding How should I use this medicine? This medicine is for injection under the skin. It is given by a health care professional in a hospital or clinic setting. A special MedGuide will be given to you before each treatment. Be sure to read this information carefully each time. For Prolia, talk to your pediatrician regarding the use of this medicine in children. Special care may be needed. For Delton See, talk to your pediatrician regarding the use of this medicine in children. While this drug may be prescribed for children as young as 13 years for selected conditions, precautions do  apply. Overdosage: If you think you have taken too much of this medicine contact a poison control center or emergency room at once. NOTE: This medicine is only for you. Do not share this medicine with others. What if I miss a dose? It is important not to miss your dose. Call your doctor or health care professional if you are unable to keep an appointment. What may interact with this medicine? Do not take this medicine with any of the following medications:  other medicines containing denosumab This medicine may also interact with the following medications:  medicines that lower your chance of fighting infection  steroid medicines like prednisone or cortisone This list may not describe all possible interactions. Give your health care provider a list of all the medicines, herbs, non-prescription drugs, or dietary supplements you use. Also tell them if you smoke, drink alcohol, or use illegal drugs. Some items may interact with your medicine. What should I watch for while using this medicine? Visit your doctor or health care professional for regular checks on your progress. Your doctor or health care professional may order blood tests and other tests to see how you are doing. Call your doctor or health care professional for advice if you get a fever, chills or sore throat, or other symptoms of a cold or flu. Do not treat yourself. This drug may decrease your body's ability to fight infection. Try to avoid being around people who are sick. You should make sure you get enough calcium and  vitamin D while you are taking this medicine, unless your doctor tells you not to. Discuss the foods you eat and the vitamins you take with your health care professional. See your dentist regularly. Brush and floss your teeth as directed. Before you have any dental work done, tell your dentist you are receiving this medicine. Do not become pregnant while taking this medicine or for 5 months after stopping it. Talk  with your doctor or health care professional about your birth control options while taking this medicine. Women should inform their doctor if they wish to become pregnant or think they might be pregnant. There is a potential for serious side effects to an unborn child. Talk to your health care professional or pharmacist for more information. What side effects may I notice from receiving this medicine? Side effects that you should report to your doctor or health care professional as soon as possible:  allergic reactions like skin rash, itching or hives, swelling of the face, lips, or tongue  bone pain  breathing problems  dizziness  jaw pain, especially after dental work  redness, blistering, peeling of the skin  signs and symptoms of infection like fever or chills; cough; sore throat; pain or trouble passing urine  signs of low calcium like fast heartbeat, muscle cramps or muscle pain; pain, tingling, numbness in the hands or feet; seizures  unusual bleeding or bruising  unusually weak or tired Side effects that usually do not require medical attention (report to your doctor or health care professional if they continue or are bothersome):  constipation  diarrhea  headache  joint pain  loss of appetite  muscle pain  runny nose  tiredness  upset stomach This list may not describe all possible side effects. Call your doctor for medical advice about side effects. You may report side effects to FDA at 1-800-FDA-1088. Where should I keep my medicine? This medicine is only given in a clinic, doctor's office, or other health care setting and will not be stored at home. NOTE: This sheet is a summary. It may not cover all possible information. If you have questions about this medicine, talk to your doctor, pharmacist, or health care provider.  2020 Elsevier/Gold Standard (2017-11-28 16:10:44)   Zoster Vaccine, Recombinant injection What is this medicine? ZOSTER VACCINE (ZOS  ter vak SEEN) is used to prevent shingles in adults 61 years old and over. This vaccine is not used to treat shingles or nerve pain from shingles. This medicine may be used for other purposes; ask your health care provider or pharmacist if you have questions. COMMON BRAND NAME(S): [facility] What should I tell my health care provider before I take this medicine? They need to know if you have any of these conditions:  blood disorders or disease  cancer like leukemia or lymphoma  immune system problems or therapy  an unusual or allergic reaction to vaccines, other medications, foods, dyes, or preservatives  pregnant or trying to get pregnant  breast-feeding How should I use this medicine? This vaccine is for injection in a muscle. It is given by a health care professional. Talk to your pediatrician regarding the use of this medicine in children. This medicine is not approved for use in children. Overdosage: If you think you have taken too much of this medicine contact a poison control center or emergency room at once. NOTE: This medicine is only for you. Do not share this medicine with others. What if I miss a dose? Keep appointments for follow-up (booster)  doses as directed. It is important not to miss your dose. Call your doctor or health care professional if you are unable to keep an appointment. What may interact with this medicine?  medicines that suppress your immune system  medicines to treat cancer  steroid medicines like prednisone or cortisone This list may not describe all possible interactions. Give your health care provider a list of all the medicines, herbs, non-prescription drugs, or dietary supplements you use. Also tell them if you smoke, drink alcohol, or use illegal drugs. Some items may interact with your medicine. What should I watch for while using this medicine? Visit your doctor for regular check ups. This vaccine, like all vaccines, may not fully protect  everyone. What side effects may I notice from receiving this medicine? Side effects that you should report to your doctor or health care professional as soon as possible:  allergic reactions like skin rash, itching or hives, swelling of the face, lips, or tongue  breathing problems Side effects that usually do not require medical attention (report these to your doctor or health care professional if they continue or are bothersome):  chills  headache  fever  nausea, vomiting  redness, warmth, pain, swelling or itching at site where injected  tiredness This list may not describe all possible side effects. Call your doctor for medical advice about side effects. You may report side effects to FDA at 1-800-FDA-1088. Where should I keep my medicine? This vaccine is only given in a clinic, pharmacy, doctor's office, or other health care setting and will not be stored at home. NOTE: This sheet is a summary. It may not cover all possible information. If you have questions about this medicine, talk to your doctor, pharmacist, or health care provider.  2020 Elsevier/Gold Standard (2017-03-03 13:20:30)

## 2020-06-23 NOTE — Telephone Encounter (Signed)
Review of pts weight she has lost 14 lbs since 05/2019 I rec mammogram now is she agreeable  And  Colonoscopy now and GI work up  Is she agreeable   Is she skipping meals?, night sweats? Fever at night ?  Fasting labs asap and f/u sch f/u in 2 months wt loss   TMS

## 2020-06-26 NOTE — Telephone Encounter (Signed)
Left message to return call 

## 2020-06-26 NOTE — Telephone Encounter (Signed)
Pt returned your call and would like a call back .. °

## 2020-06-27 NOTE — Telephone Encounter (Signed)
Has she called cologaurd yet?

## 2020-06-27 NOTE — Telephone Encounter (Signed)
Patient declines colonoscopy.  States she does not skip meals, have night sweats, or night fever.   She states she will call [facility] next week to schedule her mammogram.   Patient is scheduled for labs in December and a follow up in January 2022.

## 2020-06-27 NOTE — Telephone Encounter (Signed)
Left message to return call on both numbers listed below.  [phone] (Home) *Preferred*  [phone] [facility])

## 2020-06-28 NOTE — Telephone Encounter (Signed)
Yes place order on my desk and I will sign and date to be faxed  For some reason I have trouble with electronic orders going through for cologuard   Thanks TMS

## 2020-07-04 NOTE — Telephone Encounter (Signed)
Order placed via Epic portal for [employer]

## 2020-07-05 DIAGNOSIS — H40013 Open angle with borderline findings, low risk, bilateral: Secondary | ICD-10-CM | POA: Diagnosis not present

## 2020-07-06 ENCOUNTER — Other Ambulatory Visit: Payer: Self-pay

## 2020-07-06 ENCOUNTER — Other Ambulatory Visit: Payer: 59

## 2020-07-14 ENCOUNTER — Other Ambulatory Visit: Payer: 59

## 2020-07-17 ENCOUNTER — Other Ambulatory Visit: Payer: Self-pay

## 2020-07-17 ENCOUNTER — Other Ambulatory Visit (INDEPENDENT_AMBULATORY_CARE_PROVIDER_SITE_OTHER): Payer: 59

## 2020-07-17 DIAGNOSIS — Z0184 Encounter for antibody response examination: Secondary | ICD-10-CM | POA: Diagnosis not present

## 2020-07-17 DIAGNOSIS — Z1389 Encounter for screening for other disorder: Secondary | ICD-10-CM | POA: Diagnosis not present

## 2020-07-17 DIAGNOSIS — Z1329 Encounter for screening for other suspected endocrine disorder: Secondary | ICD-10-CM

## 2020-07-17 DIAGNOSIS — E785 Hyperlipidemia, unspecified: Secondary | ICD-10-CM | POA: Diagnosis not present

## 2020-07-17 DIAGNOSIS — Z0000 Encounter for general adult medical examination without abnormal findings: Secondary | ICD-10-CM | POA: Diagnosis not present

## 2020-07-17 DIAGNOSIS — E559 Vitamin D deficiency, unspecified: Secondary | ICD-10-CM

## 2020-07-17 LAB — COMPREHENSIVE METABOLIC PANEL
ALT: 16 U/L (ref 0–35)
AST: 20 U/L (ref 0–37)
Albumin: 4.4 g/dL (ref 3.5–5.2)
Alkaline Phosphatase: 63 U/L (ref 39–117)
BUN: 12 mg/dL (ref 6–23)
CO2: 32 mEq/L (ref 19–32)
Calcium: 9.5 mg/dL (ref 8.4–10.5)
Chloride: 104 mEq/L (ref 96–112)
Creatinine, Ser: 0.75 mg/dL (ref 0.40–1.20)
GFR: 85.82 mL/min (ref >60.00)
Glucose, Bld: 99 mg/dL (ref 70–99)
Potassium: 4.3 mEq/L (ref 3.5–5.1)
Sodium: 141 mEq/L (ref 135–145)
Total Bilirubin: 1 mg/dL (ref 0.2–1.2)
Total Protein: 7.1 g/dL (ref 6.0–8.3)

## 2020-07-17 LAB — CBC WITH DIFFERENTIAL/PLATELET
Basophils Absolute: 0 10*3/uL (ref 0.0–0.1)
Basophils Relative: 0.6 % (ref 0.0–3.0)
Eosinophils Absolute: 0.1 10*3/uL (ref 0.0–0.7)
Eosinophils Relative: 1.9 % (ref 0.0–5.0)
HCT: 38.4 % (ref 36.0–46.0)
Hemoglobin: 13 g/dL (ref 12.0–15.0)
Lymphocytes Relative: 37.1 % (ref 12.0–46.0)
Lymphs Abs: 2.2 10*3/uL (ref 0.7–4.0)
MCHC: 33.9 g/dL (ref 30.0–36.0)
MCV: 87.8 fl (ref 78.0–100.0)
Monocytes Absolute: 0.3 10*3/uL (ref 0.1–1.0)
Monocytes Relative: 4.7 % (ref 3.0–12.0)
Neutro Abs: 3.3 10*3/uL (ref 1.4–7.7)
Neutrophils Relative %: 55.7 % (ref 43.0–77.0)
Platelets: 205 10*3/uL (ref 150.0–400.0)
RBC: 4.37 Mil/uL (ref 3.87–5.11)
RDW: 13.3 % (ref 11.5–15.5)
WBC: 5.9 10*3/uL (ref 4.0–10.5)

## 2020-07-17 LAB — LIPID PANEL
Cholesterol: 201 mg/dL — ABNORMAL HIGH (ref 0–200)
HDL: 68.6 mg/dL (ref >39.00)
LDL Cholesterol: 116 mg/dL — ABNORMAL HIGH (ref 0–99)
NonHDL: 132.72
Total CHOL/HDL Ratio: 3
Triglycerides: 84 mg/dL (ref 0.0–149.0)
VLDL: 16.8 mg/dL (ref 0.0–40.0)

## 2020-07-17 LAB — VITAMIN D 25 HYDROXY (VIT D DEFICIENCY, FRACTURES): VITD: 36.28 ng/mL (ref 30.00–100.00)

## 2020-07-18 DIAGNOSIS — Z1211 Encounter for screening for malignant neoplasm of colon: Secondary | ICD-10-CM | POA: Diagnosis not present

## 2020-07-18 DIAGNOSIS — Z1212 Encounter for screening for malignant neoplasm of rectum: Secondary | ICD-10-CM | POA: Diagnosis not present

## 2020-07-18 LAB — URINALYSIS, ROUTINE W REFLEX MICROSCOPIC
Bilirubin Urine: NEGATIVE
Glucose, UA: NEGATIVE
Hgb urine dipstick: NEGATIVE
Ketones, ur: NEGATIVE
Leukocytes,Ua: NEGATIVE
Nitrite: NEGATIVE
Protein, ur: NEGATIVE
Specific Gravity, Urine: 1.014 (ref 1.001–1.03)
pH: 5.5 (ref 5.0–8.0)

## 2020-07-18 LAB — HEPATITIS B SURFACE ANTIBODY, QUANTITATIVE: Hep B S AB Quant (Post): 11 m[IU]/mL (ref >=10)

## 2020-07-18 LAB — TSH: TSH: 1.24 mIU/L (ref 0.40–4.50)

## 2020-07-20 ENCOUNTER — Telehealth: Payer: Self-pay

## 2020-07-20 NOTE — Telephone Encounter (Signed)
-----   Message from Bevelyn Buckles, MD sent at 07/18/2020  9:04 AM EST ----- Urine normal Thyroid normal  Vitamin D normal  Cholesterol elevated  -mail handout  -is she taking cholesterol medication? She can try QOD if not Blood cts normal  Liver kidneys normal   Sch appt in 4 months for f/u weight in office

## 2020-07-25 ENCOUNTER — Telehealth: Payer: Self-pay

## 2020-07-25 LAB — COLOGUARD
COLOGUARD: NEGATIVE
Cologuard: NEGATIVE

## 2020-07-25 NOTE — Telephone Encounter (Signed)
Left message to return call 

## 2020-07-25 NOTE — Telephone Encounter (Signed)
Pt returned your call about lab results. She states that she was at work yesterday and could not answer the phone. Please call back

## 2020-07-25 NOTE — Telephone Encounter (Signed)
Pt returned your call.  

## 2020-07-25 NOTE — Telephone Encounter (Signed)
Tilford Pillar, CMA  07/24/2020  2:18 PM EST Back to Top     3 attempts to contact Patient have been made via previous result note. Letter sent and Left message to return call.   Tilford Pillar, CMA  07/24/2020  2:15 PM EST      Left message to return call.   3rd attempt letter sent    Allean Found, [facility]  07/20/2020  9:58 AM EST      LVM for the patient to call back for lab results.  Nina,cma    Allean Found, CMA  07/20/2020  9:03 AM EST      LVM for the patient to call back for lab results.  Nina,cma    Pasty Spillers McLean-Scocuzza, MD  07/18/2020  4:19 PM EST      Urine normal Thyroid normal  Vitamin D normal  Cholesterol elevated  -mail handout  -is she taking cholesterol medication? She can try QOD if not Blood cts normal  Liver kidneys normal  Hep B protected    Sch appt in 4 months for f/u weight in office

## 2020-07-25 NOTE — Telephone Encounter (Signed)
Patient informed and verbalized understanding.  Handout mailed with letter of lab results  Confirmed 08/29/20 at 2:30 appointment with the Patient

## 2020-08-01 ENCOUNTER — Encounter: Payer: Self-pay | Admitting: Internal Medicine

## 2020-08-01 ENCOUNTER — Telehealth: Payer: Self-pay | Admitting: Internal Medicine

## 2020-08-01 NOTE — Telephone Encounter (Signed)
cologuard negative  07/25/20

## 2020-08-11 ENCOUNTER — Encounter: Payer: Self-pay | Admitting: Internal Medicine

## 2020-08-29 ENCOUNTER — Ambulatory Visit: Payer: 59 | Admitting: Internal Medicine

## 2020-09-21 ENCOUNTER — Other Ambulatory Visit: Payer: Self-pay

## 2020-09-21 ENCOUNTER — Ambulatory Visit: Payer: 59 | Admitting: Internal Medicine

## 2020-09-21 ENCOUNTER — Other Ambulatory Visit (HOSPITAL_COMMUNITY)
Admission: RE | Admit: 2020-09-21 | Discharge: 2020-09-21 | Disposition: A | Discharge status: Home / Self Care | Payer: 59 | Source: Ambulatory Visit | Attending: Internal Medicine | Admitting: Internal Medicine

## 2020-09-21 ENCOUNTER — Encounter: Payer: Self-pay | Admitting: Internal Medicine

## 2020-09-21 VITALS — BP 122/76 | HR 89 | Temp 98.4°F | Ht 59.84 in | Wt 107.0 lb

## 2020-09-21 DIAGNOSIS — H409 Unspecified glaucoma: Secondary | ICD-10-CM

## 2020-09-21 DIAGNOSIS — Z124 Encounter for screening for malignant neoplasm of cervix: Secondary | ICD-10-CM | POA: Insufficient documentation

## 2020-09-21 DIAGNOSIS — R7303 Prediabetes: Secondary | ICD-10-CM | POA: Diagnosis not present

## 2020-09-21 DIAGNOSIS — Z0000 Encounter for general adult medical examination without abnormal findings: Secondary | ICD-10-CM | POA: Diagnosis not present

## 2020-09-21 DIAGNOSIS — E785 Hyperlipidemia, unspecified: Secondary | ICD-10-CM | POA: Diagnosis not present

## 2020-09-21 DIAGNOSIS — Z23 Encounter for immunization: Secondary | ICD-10-CM

## 2020-09-21 DIAGNOSIS — Z1329 Encounter for screening for other suspected endocrine disorder: Secondary | ICD-10-CM | POA: Diagnosis not present

## 2020-09-21 DIAGNOSIS — Z1389 Encounter for screening for other disorder: Secondary | ICD-10-CM | POA: Diagnosis not present

## 2020-09-21 DIAGNOSIS — Z0184 Encounter for antibody response examination: Secondary | ICD-10-CM

## 2020-09-21 NOTE — Patient Instructions (Addendum)
Consider shingrix vaccine for shingles in the future  Tdap (Tetanus, Diphtheria, Pertussis) Vaccine: What You Need to Know 1. Why get vaccinated? Tdap vaccine can prevent tetanus, diphtheria, and pertussis. Diphtheria and pertussis spread from person to person. Tetanus enters the body through cuts or wounds.  TETANUS (T) causes painful stiffening of the muscles. Tetanus can lead to serious health problems, including being unable to open the mouth, having trouble swallowing and breathing, or death.  DIPHTHERIA (D) can lead to difficulty breathing, heart failure, paralysis, or death.  PERTUSSIS (aP), also known as "whooping cough," can cause uncontrollable, violent coughing that makes it hard to breathe, eat, or drink. Pertussis can be extremely serious especially in babies and young children, causing pneumonia, convulsions, brain damage, or death. In teens and adults, it can cause weight loss, loss of bladder control, passing out, and rib fractures from severe coughing. 2. Tdap vaccine Tdap is only for children 7 years and older, adolescents, and adults.  Adolescents should receive a single dose of Tdap, preferably at age 36 or 12 years. Pregnant people should get a dose of Tdap during every pregnancy, preferably during the early part of the third trimester, to help protect the newborn from pertussis. Infants are most at risk for severe, life-threatening complications from pertussis. Adults who have never received Tdap should get a dose of Tdap. Also, adults should receive a booster dose of either Tdap or Td (a different vaccine that protects against tetanus and diphtheria but not pertussis) every 10 years, or after 5 years in the case of a severe or dirty wound or burn. Tdap may be given at the same time as other vaccines. 3. Talk with your health care provider Tell your vaccine provider if the person getting the vaccine:  Has had an allergic reaction after a previous dose of any vaccine that  protects against tetanus, diphtheria, or pertussis, or has any severe, life-threatening allergies  Has had a coma, decreased level of consciousness, or prolonged seizures within 7 days after a previous dose of any pertussis vaccine (DTP, DTaP, or Tdap)  Has seizures or another nervous system problem  Has ever had Guillain-Barr Syndrome (also called "GBS")  Has had severe pain or swelling after a previous dose of any vaccine that protects against tetanus or diphtheria In some cases, your health care provider may decide to postpone Tdap vaccination until a future visit. People with minor illnesses, such as a cold, may be vaccinated. People who are moderately or severely ill should usually wait until they recover before getting Tdap vaccine.  Your health care provider can give you more information. 4. Risks of a vaccine reaction  Pain, redness, or swelling where the shot was given, mild fever, headache, feeling tired, and nausea, vomiting, diarrhea, or stomachache sometimes happen after Tdap vaccination. People sometimes faint after medical procedures, including vaccination. Tell your provider if you feel dizzy or have vision changes or ringing in the ears.  As with any medicine, there is a very remote chance of a vaccine causing a severe allergic reaction, other serious injury, or death. 5. What if there is a serious problem? An allergic reaction could occur after the vaccinated person leaves the clinic. If you see signs of a severe allergic reaction (hives, swelling of the face and throat, difficulty breathing, a fast heartbeat, dizziness, or weakness), call 9-1-1 and get the person to the nearest hospital. For other signs that concern you, call your health care provider.  Adverse reactions should be reported to  the Vaccine Adverse Event Reporting System (VAERS). Your health care provider will usually file this report, or you can do it yourself. Visit the VAERS website at www.vaers.LAgents.no or  call 807 112 1476. VAERS is only for reporting reactions, and VAERS staff members do not give medical advice. 6. The National Vaccine Injury Compensation Program The [employer] Vaccine Injury Compensation Program (VICP) is a federal program that was created to compensate people who may have been injured by certain vaccines. Claims regarding alleged injury or death due to vaccination have a time limit for filing, which may be as short as two years. Visit the VICP website at SpiritualWord.at or call (331) 605-0183 to learn about the program and about filing a claim. 7. How can I learn more?  Ask your health care provider.  Call your local or state health department.  Visit the website of the Food and Drug Administration (FDA) for vaccine package inserts and additional information at FinderList.no.  Contact the Centers for Disease Control and Prevention (CDC): ? Call 484-808-9713 (1-800-CDC-INFO) or ? Visit CDC's website at PicCapture.uy. Vaccine Information Statement Tdap (Tetanus, Diphtheria, Pertussis) Vaccine (03/10/2020) This information is not intended to replace advice given to you by your health care provider. Make sure you discuss any questions you have with your health care provider. Document Revised: 04/05/2020 Document Reviewed: 04/05/2020 Elsevier Patient Education  2021 Elsevier Inc.  Zoster Vaccine, Recombinant injection What is this medicine? ZOSTER VACCINE (ZOS ter vak SEEN) is a vaccine used to reduce the risk of getting shingles. This vaccine is not used to treat shingles or nerve pain from shingles. This medicine may be used for other purposes; ask your health care provider or pharmacist if you have questions. COMMON BRAND NAME(S): [facility] What should I tell my health care provider before I take this medicine? They need to know if you have any of these conditions:  cancer  immune system problems  an unusual or  allergic reaction to Zoster vaccine, other medications, foods, dyes, or preservatives  pregnant or trying to get pregnant  breast-feeding How should I use this medicine? This vaccine is injected into a muscle. It is given by a health care provider. A copy of Vaccine Information Statements will be given before each vaccination. Be sure to read this information carefully each time. This sheet may change often. Talk to your health care provider about the use of this vaccine in children. This vaccine is not approved for use in children. Overdosage: If you think you have taken too much of this medicine contact a poison control center or emergency room at once. NOTE: This medicine is only for you. Do not share this medicine with others. What if I miss a dose? Keep appointments for follow-up (booster) doses. It is important not to miss your dose. Call your health care provider if you are unable to keep an appointment. What may interact with this medicine?  medicines that suppress your immune system  medicines to treat cancer  steroid medicines like prednisone or cortisone This list may not describe all possible interactions. Give your health care provider a list of all the medicines, herbs, non-prescription drugs, or dietary supplements you use. Also tell them if you smoke, drink alcohol, or use illegal drugs. Some items may interact with your medicine. What should I watch for while using this medicine? Visit your health care provider regularly. This vaccine, like all vaccines, may not fully protect everyone. What side effects may I notice from receiving this medicine? Side effects that you should  report to your doctor or health care professional as soon as possible:  allergic reactions (skin rash, itching or hives; swelling of the face, lips, or tongue)  trouble breathing Side effects that usually do not require medical attention (report these to your doctor or health care professional if  they continue or are bothersome):  chills  headache  fever  nausea  pain, redness, or irritation at site where injected  tiredness  vomiting This list may not describe all possible side effects. Call your doctor for medical advice about side effects. You may report side effects to FDA at 1-800-FDA-1088. Where should I keep my medicine? This vaccine is only given by a health care provider. It will not be stored at home. NOTE: This sheet is a summary. It may not cover all possible information. If you have questions about this medicine, talk to your doctor, pharmacist, or health care provider.  2021 Elsevier/Gold Standard (2019-08-27 16:23:07)

## 2020-09-21 NOTE — Progress Notes (Signed)
Chief Complaint  Patient presents with  . Follow-up   F/u  1. Weight loss but now wt stable and up and at home weight was 106/7 2. Will do pap today  Review of Systems  Constitutional: Negative for weight loss.  HENT: Negative for hearing loss.   Eyes: Negative for blurred vision.  Respiratory: Negative for shortness of breath.   Cardiovascular: Negative for chest pain.  Gastrointestinal: Negative for abdominal pain.  Musculoskeletal: Negative for falls and joint pain.  Skin: Negative for rash.  Neurological: Negative for headaches.  Psychiatric/Behavioral: Negative for depression.   Past Medical History:  Diagnosis Date  . Ankle pain, chronic 06/22/2015  . COVID-19    08/2020  . Hyperlipidemia 07/05/2016  . Sacral fracture (HCC)    03/23/19   Past Surgical History:  Procedure Laterality Date  . APPENDECTOMY  1988   Family History  Problem Relation Age of Onset  . Heart disease Mother   . Heart disease Father   . Stroke Paternal Grandfather   . Hypertension Paternal Grandfather   . Diabetes Paternal Grandfather   . Breast cancer Neg Hx    Social History   Socioeconomic History  . Marital status: Married    Spouse name: Not on file  . Number of children: Not on file  . Years of education: Not on file  . Highest education level: Not on file  Occupational History  . Not on file  Tobacco Use  . Smoking status: Never Smoker  . Smokeless tobacco: Never Used  Substance and Sexual Activity  . Alcohol use: No  . Drug use: No  . Sexual activity: Not on file  Other Topics Concern  . Not on file  Social History Narrative   Married    Kids    From [place] family still there    Nurse in ICU   Social Determinants of Health   Financial Resource Strain: Not on file  Food Insecurity: Not on file  Transportation Needs: Not on file  Physical Activity: Not on file  Stress: Not on file  Social Connections: Not on file  Intimate Partner Violence: Not on file   Current  Meds  Medication Sig  . rosuvastatin (CRESTOR) 5 MG tablet Take 1 tablet (5 mg total) by mouth daily. At night   Allergies  Allergen Reactions  . Fosamax [Alendronate]     Nausea    Recent Results (from the past 2160 hour(s))  Hepatitis B surface antibody,quantitative     Status: None   Collection Time: 07/17/20  9:17 AM  Result Value Ref Range   Hepatitis B-Post 11 > OR = 10 mIU/mL    Comment: . Patient has immunity to hepatitis B virus. . For additional information, please refer to http://education.questdiagnostics.com/faq/FAQ105 (This link is being provided for informational/ educational purposes only).   Vitamin D (25 hydroxy)     Status: None   Collection Time: 07/17/20  9:17 AM  Result Value Ref Range   VITD 36.28 30.00 - 100.00 ng/mL  Urinalysis, Routine w reflex microscopic     Status: None   Collection Time: 07/17/20  9:17 AM  Result Value Ref Range   Color, Urine YELLOW YELLOW   APPearance CLEAR CLEAR   Specific Gravity, Urine 1.014 1.001 - 1.03   pH 5.5 5.0 - 8.0   Glucose, UA NEGATIVE NEGATIVE   Bilirubin Urine NEGATIVE NEGATIVE   Ketones, ur NEGATIVE NEGATIVE   Hgb urine dipstick NEGATIVE NEGATIVE   Protein, ur NEGATIVE NEGATIVE  Nitrite NEGATIVE NEGATIVE   Leukocytes,Ua NEGATIVE NEGATIVE  TSH     Status: None   Collection Time: 07/17/20  9:17 AM  Result Value Ref Range   TSH 1.24 0.40 - 4.50 mIU/L  CBC with Differential/Platelet     Status: None   Collection Time: 07/17/20  9:17 AM  Result Value Ref Range   WBC 5.9 4.0 - 10.5 K/uL   RBC 4.37 3.87 - 5.11 Mil/uL   Hemoglobin 13.0 12.0 - 15.0 g/dL   HCT 69.7 94.8 - 01.6 %   MCV 87.8 78.0 - 100.0 fl   MCHC 33.9 30.0 - 36.0 g/dL   RDW 55.3 74.8 - 27.0 %   Platelets 205.0 150.0 - 400.0 K/uL   Neutrophils Relative % 55.7 43.0 - 77.0 %   Lymphocytes Relative 37.1 12.0 - 46.0 %   Monocytes Relative 4.7 3.0 - 12.0 %   Eosinophils Relative 1.9 0.0 - 5.0 %   Basophils Relative 0.6 0.0 - 3.0 %   Neutro  Abs 3.3 1.4 - 7.7 K/uL   Lymphs Abs 2.2 0.7 - 4.0 K/uL   Monocytes Absolute 0.3 0.1 - 1.0 K/uL   Eosinophils Absolute 0.1 0.0 - 0.7 K/uL   Basophils Absolute 0.0 0.0 - 0.1 K/uL  Lipid panel     Status: Abnormal   Collection Time: 07/17/20  9:17 AM  Result Value Ref Range   Cholesterol 201 (H) 0 - 200 mg/dL    Comment: ATP III Classification       Desirable:  < 200 mg/dL               Borderline High:  200 - 239 mg/dL          High:  > = 786 mg/dL   Triglycerides 75.4 0.0 - 149.0 mg/dL    Comment: Normal:  <492 mg/dLBorderline High:  150 - 199 mg/dL   HDL 01.00 >71.21 mg/dL   VLDL 97.5 0.0 - 88.3 mg/dL   LDL Cholesterol 254 (H) 0 - 99 mg/dL   Total CHOL/HDL Ratio 3     Comment:                Men          Women1/2 Average Risk     3.4          3.3Average Risk          5.0          4.42X Average Risk          9.6          7.13X Average Risk          15.0          11.0                       NonHDL 132.72     Comment: NOTE:  Non-HDL goal should be 30 mg/dL higher than patient's LDL goal (i.e. LDL goal of < 70 mg/dL, would have non-HDL goal of < 100 mg/dL)  Comprehensive metabolic panel     Status: None   Collection Time: 07/17/20  9:17 AM  Result Value Ref Range   Sodium 141 135 - 145 mEq/L   Potassium 4.3 3.5 - 5.1 mEq/L   Chloride 104 96 - 112 mEq/L   CO2 32 19 - 32 mEq/L   Glucose, Bld 99 70 - 99 mg/dL   BUN 12 6 - 23 mg/dL   Creatinine, Ser 9.82  0.40 - 1.20 mg/dL   Total Bilirubin 1.0 0.2 - 1.2 mg/dL   Alkaline Phosphatase 63 39 - 117 U/L   AST 20 0 - 37 U/L   ALT 16 0 - 35 U/L   Total Protein 7.1 6.0 - 8.3 g/dL   Albumin 4.4 3.5 - 5.2 g/dL   GFR 40.9885.82 >11.91>60.00 mL/min    Comment: Calculated using the CKD-EPI Creatinine Equation (2021)   Calcium 9.5 8.4 - 10.5 mg/dL  Cologuard     Status: None   Collection Time: 07/25/20 12:00 AM  Result Value Ref Range   Cologuard Negative Negative   Objective  Body mass index is 21.01 kg/m. Wt Readings from Last 3 Encounters:  09/21/20  107 lb (48.5 kg)  06/23/20 94 lb 12.8 oz (43 kg)  05/18/19 108 lb 6.4 oz (49.2 kg)   Temp Readings from Last 3 Encounters:  09/21/20 98.4 F (36.9 C) (Oral)  06/23/20 98.2 F (36.8 C) (Oral)  05/18/19 (!) 97.5 F (36.4 C) (Oral)   BP Readings from Last 3 Encounters:  09/21/20 122/76  06/23/20 122/82  05/18/19 122/68   Pulse Readings from Last 3 Encounters:  09/21/20 89  06/23/20 (!) 103  05/18/19 75    Physical Exam Vitals and nursing note reviewed. Exam conducted with a chaperone present.  Constitutional:      Appearance: Normal appearance. She is well-developed and well-groomed.  HENT:     Head: Normocephalic and atraumatic.  Eyes:     Conjunctiva/sclera: Conjunctivae normal.     Pupils: Pupils are equal, round, and reactive to light.  Cardiovascular:     Rate and Rhythm: Normal rate and regular rhythm.     Heart sounds: Normal heart sounds. No murmur heard.   Pulmonary:     Effort: Pulmonary effort is normal.     Breath sounds: Normal breath sounds.  Abdominal:     Tenderness: There is no abdominal tenderness.  Genitourinary:    Pubic Area: No rash.      Labia:        Right: No rash.        Left: No rash.      Vagina: Normal.     Cervix: Discharge present.     Uterus: Normal.      Adnexa: Right adnexa normal and left adnexa normal.  Skin:    General: Skin is warm and dry.  Neurological:     General: No focal deficit present.     Mental Status: She is alert and oriented to person, place, and time. Mental status is at baseline.     Gait: Gait normal.  Psychiatric:        Attention and Perception: Attention and perception normal.        Mood and Affect: Mood and affect normal.        Speech: Speech normal.        Behavior: Behavior normal. Behavior is cooperative.        Thought Content: Thought content normal.        Cognition and Memory: Cognition and memory normal.        Judgment: Judgment normal.     Assessment  Plan  Prediabetes - Plan:  Comprehensive metabolic panel, Hemoglobin A1c  hld on crestor 5 mg qhs  Will start aspirin 81 tid week   Hm Had flu shotutd  Pfizer 3/3 tdap given today Consider shingrix in futuredisc today 06/23/20   Check hep B titer  Hep C negative  mammosch 10/2/20negative  pt wants 05/06/21 but will wt loss 14 lbs will have pt reassess this   +osteoporois DEXA 05/07/2019 fosamax caused nausea consider prolia   07/25/20 negative cologaurd  never had colonoscopyand declines   Pap had 06/22/15 neg pap neg HPVoverdue  Wants to wait until 60 as of 06/23/20  Pap today some yellow discharge declines testing of discharge  rec D3 4000 IU qd and calcium 600 mg qd  rec healthy diet and exercise  Provider: Dr. French Ana McLean-Scocuzza-[facility] Medicine

## 2020-09-25 LAB — CYTOLOGY - PAP
Comment: NEGATIVE
Diagnosis: NEGATIVE
High risk HPV: NEGATIVE

## 2020-10-19 ENCOUNTER — Telehealth: Payer: Self-pay | Admitting: Internal Medicine

## 2020-10-19 NOTE — Telephone Encounter (Signed)
Added to previous encounter  

## 2020-10-19 NOTE — Telephone Encounter (Signed)
Left message to return call 

## 2020-10-19 NOTE — Telephone Encounter (Signed)
Grier Mitts routed conversation to You 2 minutes ago (3:58 PM)   Grier Mitts 2 minutes ago (3:58 PM)   IJ    Patient was returning call about medication

## 2020-10-19 NOTE — Telephone Encounter (Signed)
Patient was returning call about medication 

## 2020-10-19 NOTE — Telephone Encounter (Signed)
Does she want to do prolia for osteoporosis +DEXA 05/07/2019

## 2020-10-23 NOTE — Telephone Encounter (Signed)
Pt returned your call.  

## 2020-10-23 NOTE — Telephone Encounter (Signed)
Left message to return call 

## 2020-10-23 NOTE — Telephone Encounter (Signed)
Patient informed and verbalized understanding.  Had me spell the name of the medication. Patient will look in to this and then call back in with her choice.

## 2021-02-08 NOTE — Addendum Note (Signed)
Addended by: Quentin Ore on: 02/08/2021 08:50 AM   Modules accepted: Orders

## 2021-02-19 ENCOUNTER — Other Ambulatory Visit: Payer: Self-pay

## 2021-02-19 MED FILL — Rosuvastatin Calcium Tab 5 MG: ORAL | 90 days supply | Qty: 90 | Fill #0 | Status: AC

## 2021-04-30 ENCOUNTER — Other Ambulatory Visit: Payer: Self-pay | Admitting: Internal Medicine

## 2021-04-30 DIAGNOSIS — Z1231 Encounter for screening mammogram for malignant neoplasm of breast: Secondary | ICD-10-CM

## 2021-05-15 ENCOUNTER — Other Ambulatory Visit: Payer: Self-pay

## 2021-05-15 ENCOUNTER — Ambulatory Visit
Admission: RE | Admit: 2021-05-15 | Discharge: 2021-05-15 | Disposition: A | Discharge status: Home / Self Care | Payer: 59 | Source: Ambulatory Visit | Attending: Internal Medicine | Admitting: Internal Medicine

## 2021-05-15 DIAGNOSIS — Z1231 Encounter for screening mammogram for malignant neoplasm of breast: Secondary | ICD-10-CM | POA: Insufficient documentation

## 2021-06-18 MED FILL — Rosuvastatin Calcium Tab 5 MG: ORAL | 90 days supply | Qty: 90 | Fill #1 | Status: AC

## 2021-06-19 ENCOUNTER — Other Ambulatory Visit: Payer: Self-pay

## 2021-07-12 DIAGNOSIS — H52223 Regular astigmatism, bilateral: Secondary | ICD-10-CM | POA: Diagnosis not present

## 2021-07-12 DIAGNOSIS — H5203 Hypermetropia, bilateral: Secondary | ICD-10-CM | POA: Diagnosis not present

## 2021-07-12 DIAGNOSIS — H524 Presbyopia: Secondary | ICD-10-CM | POA: Diagnosis not present

## 2021-07-12 DIAGNOSIS — H259 Unspecified age-related cataract: Secondary | ICD-10-CM | POA: Diagnosis not present

## 2021-07-12 DIAGNOSIS — Z135 Encounter for screening for eye and ear disorders: Secondary | ICD-10-CM | POA: Diagnosis not present

## 2021-08-22 ENCOUNTER — Ambulatory Visit (INDEPENDENT_AMBULATORY_CARE_PROVIDER_SITE_OTHER): Payer: 59

## 2021-08-22 ENCOUNTER — Other Ambulatory Visit: Payer: Self-pay

## 2021-08-22 ENCOUNTER — Encounter: Payer: Self-pay | Admitting: Family

## 2021-08-22 ENCOUNTER — Ambulatory Visit: Payer: 59 | Admitting: Family

## 2021-08-22 VITALS — BP 132/78 | HR 75 | Temp 96.4°F | Ht 59.8 in | Wt 106.0 lb

## 2021-08-22 DIAGNOSIS — R0781 Pleurodynia: Secondary | ICD-10-CM | POA: Insufficient documentation

## 2021-08-22 DIAGNOSIS — W182XXA Fall in (into) shower or empty bathtub, initial encounter: Secondary | ICD-10-CM

## 2021-08-22 DIAGNOSIS — S4992XA Unspecified injury of left shoulder and upper arm, initial encounter: Secondary | ICD-10-CM | POA: Diagnosis not present

## 2021-08-22 DIAGNOSIS — M25512 Pain in left shoulder: Secondary | ICD-10-CM | POA: Diagnosis not present

## 2021-08-22 DIAGNOSIS — R0789 Other chest pain: Secondary | ICD-10-CM

## 2021-08-22 DIAGNOSIS — S2242XA Multiple fractures of ribs, left side, initial encounter for closed fracture: Secondary | ICD-10-CM | POA: Diagnosis not present

## 2021-08-22 MED ORDER — HYDROCODONE-ACETAMINOPHEN 5-325 MG PO TABS
1.0000 | ORAL_TABLET | Freq: Four times a day (QID) | ORAL | 0 refills | Status: DC | PRN
  Filled 2021-08-22: qty 20, 5d supply, fill #0

## 2021-08-22 MED ORDER — CYCLOBENZAPRINE HCL 5 MG PO TABS
ORAL_TABLET | ORAL | 1 refills | Status: DC
  Filled 2021-08-22: qty 30, 30d supply, fill #0

## 2021-08-22 NOTE — Assessment & Plan Note (Signed)
Patient to place grab/hand bars and base of tub grip pads in the shower to decrease fall risk.  At this time I have ordered shoulder, chest, and left rib x-rays pending results.

## 2021-08-22 NOTE — Patient Instructions (Signed)
Complete xray(s) prior to leaving today. I will notify you of your results once received.  I have sent pain medication to your pharmacy, please do not take pain medication at same time as administration as muscle relaxer as they both can result in sedation. Please separate by at least 2-4 hours.   It was a pleasure seeing you today! Please do not hesitate to reach out with any questions and or concerns.  Regards,   Mort Sawyers FNP-C

## 2021-08-22 NOTE — Progress Notes (Signed)
Established Patient Office Visit  Subjective:  Patient ID: Cynthia Zuniga, female    DOB: 11/23/1960  Age: 61 y.o. MRN: [id number]  CC:  Chief Complaint  Patient presents with   Back Pain    Patient fell in shower left middle of her back hurts     HPI Cynthia Zuniga is here today with concerns.  One week ago fell in the shower, landing directly on her left upper back/shoulder area. Since then pain is constant 10/10. If she takes motrin, maybe 7/10. Much worse with any movement. She states left upper shoulder hard to lift. Coughing/sneezing/laughing very painful. Also lidocaine patch did not give any relief. States not hard to take a deep breath, no sob, no chest pain and no palpitations.   Taking motrin 600 mg with some relief, having to take about three times a day for any relief.   PDMP reviewed, pt states tolerates norco well, no past issues.    Past Medical History:  Diagnosis Date   Ankle pain, chronic 06/22/2015   Closed fracture of sacrum with routine healing 04/30/2019   COVID-19    08/2020   Hyperlipidemia 07/05/2016   Osteoporosis with current pathological fracture 05/18/2019   Sacral fracture (HCC)    03/23/19    Past Surgical History:  Procedure Laterality Date   APPENDECTOMY  1988    Family History  Problem Relation Age of Onset   Heart disease Mother    Heart disease Father    Stroke Paternal Grandfather    Hypertension Paternal Grandfather    Diabetes Paternal Grandfather    Breast cancer Neg Hx     Social History   Socioeconomic History   Marital status: Married    Spouse name: Not on file   Number of children: Not on file   Years of education: Not on file   Highest education level: Not on file  Occupational History   Not on file  Tobacco Use   Smoking status: Never   Smokeless tobacco: Never  Substance and Sexual Activity   Alcohol use: No   Drug use: No   Sexual activity: Not on file  Other Topics Concern   Not on file  Social  History Narrative   Married    Kids    From [place] family still there    Nurse in ICU   Social Determinants of Health   Financial Resource Strain: Not on file  Food Insecurity: Not on file  Transportation Needs: Not on file  Physical Activity: Not on file  Stress: Not on file  Social Connections: Not on file  Intimate Partner Violence: Not on file    Outpatient Medications Prior to Visit  Medication Sig Dispense Refill   rosuvastatin (CRESTOR) 5 MG tablet TAKE 1 TABLET (5 MG TOTAL) BY MOUTH EVERY EVENING 90 tablet 3   No facility-administered medications prior to visit.    Allergies  Allergen Reactions   Fosamax [Alendronate]     Nausea     ROS Review of Systems  Respiratory:  Negative for cough, chest tightness, shortness of breath and wheezing.   Cardiovascular:  Negative for chest pain and palpitations.  Musculoskeletal:  Positive for arthralgias, back pain (let upper back pain) and myalgias (tenderness to left upper back, left upper shoulder).     Objective:    Physical Exam Constitutional:      General: She is not in acute distress.    Appearance: Normal appearance. She is normal weight. She is not ill-appearing, toxic-appearing  or diaphoretic.  Cardiovascular:     Rate and Rhythm: Normal rate and regular rhythm.  Pulmonary:     Effort: Pulmonary effort is normal.     Breath sounds: Normal breath sounds.     Comments: Some pain on inspiration with muscle tenderness Musculoskeletal:     Left shoulder: Tenderness present. Bony tenderness: palpated left upper scapula and lower.Decreased range of motion (painful rom). Normal strength.       Arms:     Lumbar back: Signs of trauma, spasms and tenderness (left upper mid back) present. Normal range of motion (no bruising).       Back:  Neurological:     General: No focal deficit present.     Mental Status: She is alert and oriented to person, place, and time.  Psychiatric:        Mood and Affect: Mood normal.         Behavior: Behavior normal.        Thought Content: Thought content normal.        Judgment: Judgment normal.    BP 132/78    Pulse 75    Temp (!) 96.4 F (35.8 C) (Temporal)    Ht 4' 11.8" (1.519 m)    Wt 106 lb (48.1 kg)    LMP 05/12/2015    SpO2 98%    BMI 20.84 kg/m  Wt Readings from Last 3 Encounters:  08/22/21 106 lb (48.1 kg)  09/21/20 107 lb (48.5 kg)  06/23/20 94 lb 12.8 oz (43 kg)     Health Maintenance Due  Topic Date Due   COLONOSCOPY (Pts 45-67yrs Insurance coverage will need to be confirmed)  Never done   Zoster Vaccines- Shingrix (1 of 2) Never done   COVID-19 Vaccine (4 - Booster for Pfizer series) 08/28/2020   INFLUENZA VACCINE  03/05/2021    There are no preventive care reminders to display for this patient.  Lab Results  Component Value Date   TSH 1.24 07/17/2020   Lab Results  Component Value Date   WBC 5.9 07/17/2020   HGB 13.0 07/17/2020   HCT 38.4 07/17/2020   MCV 87.8 07/17/2020   PLT 205.0 07/17/2020   Lab Results  Component Value Date   NA 141 07/17/2020   K 4.3 07/17/2020   CO2 32 07/17/2020   GLUCOSE 99 07/17/2020   BUN 12 07/17/2020   CREATININE 0.75 07/17/2020   BILITOT 1.0 07/17/2020   ALKPHOS 63 07/17/2020   AST 20 07/17/2020   ALT 16 07/17/2020   PROT 7.1 07/17/2020   ALBUMIN 4.4 07/17/2020   CALCIUM 9.5 07/17/2020   GFR 85.82 07/17/2020   Lab Results  Component Value Date   HGBA1C 6.2 04/21/2019      Assessment & Plan:   Problem List Items Addressed This Visit       Other   Rib pain on left side - Primary    Gave patient prescription for pain medication advised proper administration do not take with alcohol and/or other sedatives.  Left rib x-ray pending ordered in office.  If any sudden shortness of breath patient to immediately go to the emergency room.      Relevant Medications   HYDROcodone-acetaminophen (NORCO) 5-325 MG tablet   cyclobenzaprine (FLEXERIL) 5 MG tablet   Other Relevant Orders   DG  Ribs Unilateral Left   Acute pain of left shoulder    Left shoulder x-ray ordered pending results.  Gave patient prescription for pain medication advised proper administration do  not take with alcohol and/or other sedatives.  Handout given to patient as well for shoulder injury treatment      Relevant Medications   HYDROcodone-acetaminophen (NORCO) 5-325 MG tablet   cyclobenzaprine (FLEXERIL) 5 MG tablet   Other Relevant Orders   DG Shoulder Left   Fall in shower    Patient to place grab/hand bars and base of tub grip pads in the shower to decrease fall risk.  At this time I have ordered shoulder, chest, and left rib x-rays pending results.      Relevant Medications   HYDROcodone-acetaminophen (NORCO) 5-325 MG tablet   cyclobenzaprine (FLEXERIL) 5 MG tablet   Other Relevant Orders   DG Chest 2 View    Meds ordered this encounter  Medications   HYDROcodone-acetaminophen (NORCO) 5-325 MG tablet    Sig: Take 1 tablet by mouth every 6 (six) hours as needed for up to 5 days for moderate pain.    Dispense:  20 tablet    Refill:  0    Order Specific Question:   Supervising Provider    Answer:   BEDSOLE, AMY E [[id number]]   cyclobenzaprine (FLEXERIL) 5 MG tablet    Sig: Take one po qhs prn muscle spasm    Dispense:  30 tablet    Refill:  1    Order Specific Question:   Supervising Provider    Answer:   BEDSOLE, AMY E [[id number]]    Follow-up: Return if symptoms worsen or fail to improve.    Mort Sawyers, FNP

## 2021-08-22 NOTE — Assessment & Plan Note (Addendum)
Left shoulder x-ray ordered pending results.  Gave patient prescription for pain medication advised proper administration do not take with alcohol and/or other sedatives.  Handout given to patient as well for shoulder injury treatment

## 2021-08-22 NOTE — Assessment & Plan Note (Addendum)
Gave patient prescription for pain medication advised proper administration do not take with alcohol and/or other sedatives.  Left rib x-ray pending ordered in office.  If any sudden shortness of breath patient to immediately go to the emergency room.

## 2021-08-23 ENCOUNTER — Encounter: Payer: Self-pay | Admitting: Family

## 2021-08-23 ENCOUNTER — Other Ambulatory Visit: Payer: Self-pay | Admitting: Family

## 2021-08-23 DIAGNOSIS — M8080XA Other osteoporosis with current pathological fracture, unspecified site, initial encounter for fracture: Secondary | ICD-10-CM | POA: Insufficient documentation

## 2021-08-27 ENCOUNTER — Encounter: Payer: Self-pay | Admitting: *Deleted

## 2021-08-28 ENCOUNTER — Telehealth: Payer: Self-pay | Admitting: Internal Medicine

## 2021-08-28 NOTE — Telephone Encounter (Signed)
Patient's husband dropped off a form to be completed by Dr French Ana. Tried to schedule an appointment for patient to come in so form could be filled out. Husband stated that a NP saw wife last week. For is up front in Dr Alford Highland color folder.

## 2021-08-29 NOTE — Telephone Encounter (Signed)
Patient was seen by NP Dugal. According to mychart message sent to the Patient 08/23/21:   "She did recommend that you stay out of work for 2 to 3 weeks and we are happy to move forward with the paperwork for this.  I will write you a note initially and then when you receive the short-term disability and or FMLA paperwork please let me know and I can continue filling that out. "  FMLA paperwork received and placed on their CMA's desk

## 2021-08-30 ENCOUNTER — Encounter: Payer: Self-pay | Admitting: Internal Medicine

## 2021-08-30 DIAGNOSIS — S2249XA Multiple fractures of ribs, unspecified side, initial encounter for closed fracture: Secondary | ICD-10-CM | POA: Insufficient documentation

## 2021-08-30 DIAGNOSIS — S2232XA Fracture of one rib, left side, initial encounter for closed fracture: Secondary | ICD-10-CM | POA: Insufficient documentation

## 2021-09-02 ENCOUNTER — Encounter: Payer: Self-pay | Admitting: Internal Medicine

## 2021-09-03 ENCOUNTER — Encounter: Payer: Self-pay | Admitting: Family

## 2021-09-03 NOTE — Telephone Encounter (Signed)
Paperwork completed and faxed/scanned by Amalia Hailey 09/03/21. Pt aware was faxed

## 2021-09-03 NOTE — Progress Notes (Signed)
Patient called and she stated that she has an appointment on the 10th with Dr. Kennith Center. Patient stated she will keep that appointment and paperwork will be completed by Mort Sawyers, NP.

## 2021-09-04 NOTE — Telephone Encounter (Signed)
Pt called in requesting update on FMLA paperwork that was faxed over to our office. Pt sent over a mychart message about the FMLA paperwork. Message is attached below.  Good afternoon Dr. French Ana, I hope you had a good weekend! I had received an update from [facility] that they have not yet received the completed Medical Certification Form. This had been faxed over for completion and it needs to be sent back so that I can be approved for FMLA. Could you please see if it can be faxed over this week? I appreciate your help with this, thank you!  Pt requesting callback

## 2021-09-05 ENCOUNTER — Telehealth: Payer: Self-pay | Admitting: Internal Medicine

## 2021-09-05 NOTE — Telephone Encounter (Signed)
[employer] or [employer] states no FMLA received call the patient and figure this out  I filled out 1 packet do we have another one to fill out  Where is FMLA forms for me to reference?

## 2021-09-06 NOTE — Telephone Encounter (Signed)
Received copy of the FMLA paperwork completed by [place]. Fax confirmation attached that this was faxed and it went through. Will hold copy.

## 2021-09-13 ENCOUNTER — Other Ambulatory Visit: Payer: Self-pay

## 2021-09-13 MED ORDER — METRONIDAZOLE 500 MG PO TABS
ORAL_TABLET | ORAL | 0 refills | Status: DC
  Filled 2021-09-13: qty 16, 8d supply, fill #0

## 2021-09-13 MED ORDER — AMOXICILLIN 500 MG PO CAPS
ORAL_CAPSULE | ORAL | 0 refills | Status: DC
  Filled 2021-09-13: qty 24, 8d supply, fill #0

## 2021-09-14 ENCOUNTER — Ambulatory Visit: Payer: 59 | Admitting: Internal Medicine

## 2021-09-14 ENCOUNTER — Other Ambulatory Visit: Payer: Self-pay

## 2021-09-14 ENCOUNTER — Encounter: Payer: Self-pay | Admitting: Internal Medicine

## 2021-09-14 VITALS — BP 128/76 | HR 76 | Temp 98.1°F | Ht 59.0 in | Wt 106.8 lb

## 2021-09-14 DIAGNOSIS — R0781 Pleurodynia: Secondary | ICD-10-CM

## 2021-09-14 DIAGNOSIS — M810 Age-related osteoporosis without current pathological fracture: Secondary | ICD-10-CM

## 2021-09-14 DIAGNOSIS — Z1231 Encounter for screening mammogram for malignant neoplasm of breast: Secondary | ICD-10-CM

## 2021-09-14 DIAGNOSIS — J359 Chronic disease of tonsils and adenoids, unspecified: Secondary | ICD-10-CM

## 2021-09-14 DIAGNOSIS — W182XXA Fall in (into) shower or empty bathtub, initial encounter: Secondary | ICD-10-CM | POA: Diagnosis not present

## 2021-09-14 DIAGNOSIS — M25512 Pain in left shoulder: Secondary | ICD-10-CM

## 2021-09-14 MED ORDER — HYDROCODONE-ACETAMINOPHEN 5-325 MG PO TABS
1.0000 | ORAL_TABLET | Freq: Two times a day (BID) | ORAL | 0 refills | Status: DC | PRN
  Filled 2021-09-14: qty 30, 15d supply, fill #0

## 2021-09-14 NOTE — Addendum Note (Signed)
Addended by: Thressa Sheller on: 09/14/2021 10:44 AM   Modules accepted: Orders

## 2021-09-14 NOTE — Addendum Note (Signed)
Addended by: Gayatri Teasdale J on: 09/14/2021 10:44 AM ° ° Modules accepted: Orders ° °

## 2021-09-14 NOTE — Progress Notes (Signed)
Chief Complaint  Patient presents with   Follow-up   F/u  1. On Abx flagyl and amox due to dental tooth infection noted 09/13/21   2. Left 6th and 7th rib fractures 5/10 and left posterior shoulder pain declines ortho for now or steroid shot pain worse with reaching opening tub, flushing toilet moving left arm  Will extend fmla been out since 08/22/21 to 09/17/21 but still in pain will extend to 10/22/21 [employer] with accomodations and [place] short term   3. Tonsil lesion seen by dentist refer to ent  4. Osteoporosis with multiple fractures refer to [facility] consider reclast could not tolerate fosamx    Review of Systems  Constitutional:  Negative for weight loss.  HENT:  Negative for hearing loss.   Eyes:  Negative for blurred vision.  Respiratory:  Negative for shortness of breath.   Cardiovascular:  Negative for chest pain.  Gastrointestinal:  Negative for abdominal pain and blood in stool.  Genitourinary:  Negative for dysuria.  Musculoskeletal:  Positive for falls and joint pain.  Skin:  Negative for rash.  Neurological:  Negative for headaches.  Psychiatric/Behavioral:  Negative for depression.   Past Medical History:  Diagnosis Date   Ankle pain, chronic 06/22/2015   Closed fracture of sacrum with routine healing 04/30/2019   COVID-19    08/2020   Hyperlipidemia 07/05/2016   Osteoporosis with current pathological fracture 05/18/2019   Sacral fracture (HCC)    03/23/19   Past Surgical History:  Procedure Laterality Date   APPENDECTOMY  1988   Family History  Problem Relation Age of Onset   Heart disease Mother    Heart disease Father    Stroke Paternal Grandfather    Hypertension Paternal Grandfather    Diabetes Paternal Grandfather    Breast cancer Neg Hx    Social History   Socioeconomic History   Marital status: Married    Spouse name: Not on file   Number of children: Not on file   Years of education: Not on file   Highest education level: Not on file   Occupational History   Not on file  Tobacco Use   Smoking status: Never   Smokeless tobacco: Never  Substance and Sexual Activity   Alcohol use: No   Drug use: No   Sexual activity: Not on file  Other Topics Concern   Not on file  Social History Narrative   Married    Kids    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 No Alcohol   rosuvastatin (CRESTOR) 5 MG tablet TAKE 1 TABLET (5 MG TOTAL) BY MOUTH EVERY EVENING   Allergies  Allergen Reactions   Fosamax [Alendronate]     Nausea    No results found for this or any previous visit (from the past 2160 hour(s)). Objective  Body mass index is 21.57 kg/m. Wt Readings from Last 3 Encounters:  09/14/21 106 lb 12.8 oz (48.4  kg)  08/22/21 106 lb (48.1 kg)  09/21/20 107 lb (48.5 kg)   Temp Readings from Last 3 Encounters:  09/14/21 98.1 F (36.7 C) (Oral)  08/22/21 (!) 96.4 F (35.8 C) (Temporal)  09/21/20 98.4 F (36.9 C) (Oral)   BP Readings from Last 3 Encounters:  09/14/21 128/76  08/22/21 132/78  09/21/20 122/76   Pulse Readings from Last 3 Encounters:  09/14/21 76  08/22/21 75  09/21/20 89    Physical Exam Vitals and nursing note reviewed.  Constitutional:      Appearance: Normal appearance. She is well-developed and well-groomed.  HENT:     Head: Normocephalic and atraumatic.  Eyes:     Conjunctiva/sclera: Conjunctivae normal.     Pupils: Pupils are equal, round, and reactive to light.  Cardiovascular:     Rate and Rhythm:  Normal rate and regular rhythm.     Heart sounds: Normal heart sounds. No murmur heard. Pulmonary:     Effort: Pulmonary effort is normal.     Breath sounds: Normal breath sounds.  Abdominal:     General: Abdomen is flat. Bowel sounds are normal.     Tenderness: There is no abdominal tenderness.  Musculoskeletal:        General: No tenderness.       Arms:  Skin:    General: Skin is warm and dry.  Neurological:     General: No focal deficit present.     Mental Status: She is alert and oriented to person, place, and time. Mental status is at baseline.     Cranial Nerves: Cranial nerves 2-12 are intact.     Gait: Gait is intact.  Psychiatric:        Attention and Perception: Attention and perception normal.        Mood and Affect: Mood and affect normal.        Speech: Speech normal.        Behavior: Behavior normal. Behavior is cooperative.        Thought Content: Thought content normal.        Cognition and Memory: Cognition and memory normal.        Judgment: Judgment normal.    Assessment  Plan  Osteoporosis, unspecified osteoporosis type, unspecified pathological fracture presence - Plan: Ambulatory referral to Endocrinology  Rib pain on left side - Plan: HYDROcodone-acetaminophen (NORCO) 5-325 MG tablet, Ambulatory referral to Endocrinology See hpi  Consider ortho in future for above and below consider steroid shot left shoulder  Extend FMLA to 10/22/21   Acute pain of left shoulder - Plan: HYDROcodone-acetaminophen (NORCO) 5-325 MG tablet  Lesion of tonsil - Plan: Ambulatory referral to ENT   HM Had flu shot utd  Pfizer 3/3 declines further tdap utd Consider shingrix in future disc today 06/23/20    Hep B immune   Hep C negative   Mammo 05/15/21 negative    +osteoporois DEXA 05/07/2019 fosamax caused nausea consider prolia  Referred to endocrine   07/25/20 negative cologaurd  never had colonoscopy and declines  09/14/21   Pap had 07/21/21 neg pap neg HPV      rec D3 4000 IU qd and calcium 600 mg qd    rec healthy diet and exercise  Provider: Dr. French Ana McLean-Scocuzza-Internal Medicine

## 2021-09-14 NOTE — Patient Instructions (Addendum)
[employer]  FMLA for more leave and acommodations  Short term disability to extend [employer]    Consider probiotics with antibiotics kefir yogurt or any yogurt or pill (Align or Culturelle)  Consider ortho referral for steroid shot in left posterior shoulder   [place] 4.3 21 [employer] [facility] in [place], [place] COVID-19 info: [email or web address] Address: [address] [place],  [address] Hours:  Open ? Closes 8?PM Phone: [phone] Dr. Tami Ribas   Phone Fax E-mail Address  [phone] Not available [place] [address]     Specialties        Osteoporosis Osteoporosis happens when the bones become thin and less dense than normal. Osteoporosis makes bones more brittle and fragile and more likely to break (fracture). Over time, osteoporosis can cause your bones to become so weak that they fracture after a minor fall. Bones in the hip, wrist, and spine are most likely to fracture due to osteoporosis. What are the causes? The exact cause of this condition is not known. What increases the risk? You are more likely to develop this condition if you: Have family members with this condition. Have poor nutrition. Use the following: Steroid medicines, such as prednisone. Anti-seizure medicines. Nicotine or tobacco, such as cigarettes, e-cigarettes, and chewing tobacco. Are female. Are age 15 or older. Are not physically active (are sedentary). Are of European or Asian descent. Have a small body frame. What are the signs or symptoms? A fracture might be the first sign of osteoporosis, especially if the fracture results from a fall or injury that usually would not cause a bone to break. Other signs and symptoms include: Pain in the neck or low back. Stooped posture. Loss of height. How is this diagnosed? This condition may be diagnosed based on: Your medical  history. A physical exam. A bone mineral density test, also called a DXA or DEXA test (dual-energy X-ray absorptiometry test). This test uses X-rays to measure the amount of minerals in your bones. How is this treated? This condition may be treated by: Making lifestyle changes, such as: Including foods with more calcium and vitamin D in your diet. Doing weight-bearing and muscle-strengthening exercises. Stopping tobacco use. Limiting alcohol intake. Taking medicine to slow the process of bone loss or to increase bone density. Taking daily supplements of calcium and vitamin D. Taking hormone replacement medicines, such as estrogen for women and testosterone for men. Monitoring your levels of calcium and vitamin D. The goal of treatment is to strengthen your bones and lower your risk for a fracture. Follow these instructions at home: Eating and drinking Include calcium and vitamin D in your diet. Calcium is important for bone health, and vitamin D helps your body absorb calcium. Good sources of calcium and vitamin D include: Certain fatty fish, such as salmon and tuna. Products that have calcium and vitamin D added to them (are fortified), such as fortified cereals. Egg yolks. Cheese. Liver.  Activity Do exercises as told by your health care provider. Ask your health care provider what exercises and activities are safe for you. You should do: Exercises that make you work against gravity (weight-bearing exercises), such as tai chi, yoga, or walking. Exercises to strengthen muscles, such as lifting weights. Lifestyle Do not drink alcohol if: Your health care provider tells you not to drink. You are pregnant, may be pregnant, or are planning to become pregnant. If you drink alcohol: Limit  how much you use to: 0-1 drink a day for women. 0-2 drinks a day for men. Know how much alcohol is in your drink. In the [place] one drink equals one 12 oz bottle of beer (355 mL), one 5 oz glass of  wine (148 mL), or one 1 oz glass of hard liquor (44 mL). Do not use any products that contain nicotine or tobacco, such as cigarettes, e-cigarettes, and chewing tobacco. If you need help quitting, ask your health care provider. Preventing falls Use devices to help you move around (mobility aids) as needed, such as canes, walkers, scooters, or crutches. Keep rooms well-lit and clutter-free. Remove tripping hazards from walkways, including cords and throw rugs. Install grab bars in bathrooms and safety rails on stairs. Use rubber mats in the bathroom and other areas that are often wet or slippery. Wear closed-toe shoes that fit well and support your feet. Wear shoes that have rubber soles or low heels. Review your medicines with your health care provider. Some medicines can cause dizziness or changes in blood pressure, which can increase your risk of falling. General instructions Take over-the-counter and prescription medicines only as told by your health care provider. Keep all follow-up visits. This is important. Contact a health care provider if: You have never been screened for osteoporosis and you are: A woman who is age 78 or older. A man who is age 59 or older. Get help right away if: You fall or injure yourself. Summary Osteoporosis is thinning and loss of density in your bones. This makes bones more brittle and fragile and more likely to break (fracture),even with minor falls. The goal of treatment is to strengthen your bones and lower your risk for a fracture. Include calcium and vitamin D in your diet. Calcium is important for bone health, and vitamin D helps your body absorb calcium. Talk with your health care provider about screening for osteoporosis if you are a woman who is age 30 or older, or a man who is age 33 or older. This information is not intended to replace advice given to you by your health care provider. Make sure you discuss any questions you have with your health  care provider. Document Revised: 01/06/2020 Document Reviewed: 01/06/2020 Elsevier Patient Education  [place].  Rib Fracture A rib fracture is a break or crack in one of the bones of the ribs. The ribs are long, curved bones that wrap around your chest and attach to your spine and your breastbone. The ribs protect your heart, lungs, and other organs in the chest. A broken or cracked rib is often painful but is not usually serious. Most rib fractures heal on their own over time. However, rib fractures can be more serious if multiple ribs are broken or if broken ribs move out of place and push against other structures or organs. What are the causes? This condition is caused by: Repetitive movements with high force, such as pitching a baseball or having very bad coughing spells. A direct hit the chest, such as a sports injury, a car crash, or a fall. Cancer that has spread to the bones, which can weaken bones and cause them to break. What are the signs or symptoms? Symptoms of this condition include: Pain when you breathe in or cough. Pain when someone presses on the injured area. Feeling short of breath. How is this diagnosed? This condition is diagnosed with a physical exam and medical history. You may also have imaging tests, such as:  Chest X-ray. CT scan. MRI. Bone scan. Chest ultrasound. How is this treated? Treatment for this condition depends on the severity of the fracture. Most rib fractures usually heal on their own in 1-3 months. Healing may take longer if you have a cough or if you do activities that make the injury worse. While you heal, you may be given medicines to control the pain. You will also be taught deep breathing exercises. Severe injuries may require hospitalization or surgery. Follow these instructions at home: Managing pain, stiffness, and swelling If directed, put ice on the injured area. To do this: Put ice in a plastic bag. Place a towel between  your skin and the bag. Leave the ice on for 20 minutes, 2-3 times a day. Remove the ice if your skin turns bright red. This is very important. If you cannot feel pain, heat, or cold, you have a greater risk of damage to the area. Take over-the-counter and prescription medicines only as told by your health care provider. Activity Avoid doing activities or movements that cause pain. Be careful during activities and avoid bumping the injured rib. Slowly increase your activity as told by your health care provider. General instructions Do deep breathing exercises as told by your health care provider. This helps prevent pneumonia, which is a common complication of a broken rib. Your health care provider may instruct you to: Take deep breaths several times a day. Try to cough several times a day, holding a pillow against the injured area. Use a device called incentive spirometer to practice deep breathing several times a day. Drink enough fluid to keep your urine pale yellow. Do not wear a rib belt or binder. These restrict breathing, which can lead to pneumonia. Keep all follow-up visits. This is important. Contact a health care provider if: You have a fever. Get help right away if: You have difficulty breathing or you are short of breath. You develop a cough that does not stop, or you cough up thick or bloody sputum. You have nausea, vomiting, or pain in your abdomen. Your pain gets worse and medicine does not help. These symptoms may represent a serious problem that is an emergency. Do not wait to see if the symptoms will go away. Get medical help right away. Call your local emergency services (911 in the [place] Do not drive yourself to the hospital. Summary A rib fracture is a break or crack in one of the bones of the ribs. A broken or cracked rib is often painful but is not usually serious. Most rib fractures heal on their own over time. Treatment for this condition depends on the severity  of the fracture. Avoid doing any activities or movements that cause pain. This information is not intended to replace advice given to you by your health care provider. Make sure you discuss any questions you have with your health care provider. Document Revised: 11/12/2019 Document Reviewed: 11/12/2019 Elsevier Patient Education  [place].  Denosumab injection What is this medication? DENOSUMAB (den oh sue mab) slows bone breakdown. Prolia is used to treat osteoporosis in women after menopause and in men, and in people who are taking corticosteroids for 6 months or more. Delton See is used to treat a high calcium level due to cancer and to prevent bone fractures and other bone problems caused by multiple myeloma or cancer bone metastases. Delton See is also used to treat giant cell tumor of the bone. This medicine may be used for other purposes; ask your health  care provider or pharmacist if you have questions. COMMON BRAND NAME(S): Prolia, XGEVA What should I tell my care team before I take this medication? They need to know if you have any of these conditions: dental disease having surgery or tooth extraction infection kidney disease low levels of calcium or Vitamin D in the blood malnutrition on hemodialysis skin conditions or sensitivity thyroid or parathyroid disease an unusual reaction to denosumab, other medicines, foods, dyes, or preservatives pregnant or trying to get pregnant breast-feeding How should I use this medication? This medicine is for injection under the skin. It is given by a health care professional in a hospital or clinic setting. A special MedGuide will be given to you before each treatment. Be sure to read this information carefully each time. For Prolia, talk to your pediatrician regarding the use of this medicine in children. Special care may be needed. For Delton See, talk to your pediatrician regarding the use of this medicine in children. While this drug may be  prescribed for children as young as 13 years for selected conditions, precautions do apply. Overdosage: If you think you have taken too much of this medicine contact a poison control center or emergency room at once. NOTE: This medicine is only for you. Do not share this medicine with others. What if I miss a dose? It is important not to miss your dose. Call your doctor or health care professional if you are unable to keep an appointment. What may interact with this medication? Do not take this medicine with any of the following medications: other medicines containing denosumab This medicine may also interact with the following medications: medicines that lower your chance of fighting infection steroid medicines like prednisone or cortisone This list may not describe all possible interactions. Give your health care provider a list of all the medicines, herbs, non-prescription drugs, or dietary supplements you use. Also tell them if you smoke, drink alcohol, or use illegal drugs. Some items may interact with your medicine. What should I watch for while using this medication? Visit your doctor or health care professional for regular checks on your progress. Your doctor or health care professional may order blood tests and other tests to see how you are doing. Call your doctor or health care professional for advice if you get a fever, chills or sore throat, or other symptoms of a cold or flu. Do not treat yourself. This drug may decrease your body's ability to fight infection. Try to avoid being around people who are sick. You should make sure you get enough calcium and vitamin D while you are taking this medicine, unless your doctor tells you not to. Discuss the foods you eat and the vitamins you take with your health care professional. See your dentist regularly. Brush and floss your teeth as directed. Before you have any dental work done, tell your dentist you are receiving this medicine. Do not  become pregnant while taking this medicine or for 5 months after stopping it. Talk with your doctor or health care professional about your birth control options while taking this medicine. Women should inform their doctor if they wish to become pregnant or think they might be pregnant. There is a potential for serious side effects to an unborn child. Talk to your health care professional or pharmacist for more information. What side effects may I notice from receiving this medication? Side effects that you should report to your doctor or health care professional as soon as possible: allergic reactions like skin  rash, itching or hives, swelling of the face, lips, or tongue bone pain breathing problems dizziness jaw pain, especially after dental work redness, blistering, peeling of the skin signs and symptoms of infection like fever or chills; cough; sore throat; pain or trouble passing urine signs of low calcium like fast heartbeat, muscle cramps or muscle pain; pain, tingling, numbness in the hands or feet; seizures unusual bleeding or bruising unusually weak or tired Side effects that usually do not require medical attention (report to your doctor or health care professional if they continue or are bothersome): constipation diarrhea headache joint pain loss of appetite muscle pain runny nose tiredness upset stomach This list may not describe all possible side effects. Call your doctor for medical advice about side effects. You may report side effects to FDA at 1-800-FDA-1088. Where should I keep my medication? This medicine is only given in a clinic, doctor's office, or other health care setting and will not be stored at home. NOTE: This sheet is a summary. It may not cover all possible information. If you have questions about this medicine, talk to your doctor, pharmacist, or health care provider.  2022 Elsevier/Gold Standard (2017-11-28 00:00:00)  Romosozumab injection What is this  medication? ROMOSOZUMAB (roe moe SOZ ue mab) increases bone formation. It is used to treat osteoporosis in women. This medicine may be used for other purposes; ask your health care provider or pharmacist if you have questions. COMMON BRAND NAME(S): EVENITY What should I tell my care team before I take this medication? They need to know if you have any of these conditions: dental disease or wear dentures heart disease history of stroke kidney disease low levels of calcium in the blood an unusual or allergic reaction to romosozumab, other medicines, foods, dyes or preservatives pregnant or trying to get pregnant breast-feeding How should I use this medication? This medicine is for injection under the skin. It is given by a healthcare professional in a hospital or clinic setting. Talk to your pediatrician about the use of this medicine in children. Special care may be needed. Overdosage: If you think you have taken too much of this medicine contact a poison control center or emergency room at once. NOTE: This medicine is only for you. Do not share this medicine with others. What if I miss a dose? Keep appointments for follow-up doses. It is important not to miss your dose. Call your doctor or healthcare professional if you are unable to keep an appointment. What may interact with this medication? Interactions are not expected. This list may not describe all possible interactions. Give your health care provider a list of all the medicines, herbs, non-prescription drugs, or dietary supplements you use. Also tell them if you smoke, drink alcohol, or use illegal drugs. Some items may interact with your medicine. What should I watch for while using this medication? Your condition will be monitored carefully while you are receiving this medicine. You may need blood work done while you are taking this medicine. Some people who take this medicine have severe bone, joint, or muscle pain. This medicine  may also increase your risk for jaw problems or a broken thigh bone. Tell your healthcare professional right away if you have severe pain in your jaw, bones, joints, or muscles. Tell your healthcare professional if you have any pain that does not go away or that gets worse. You should make sure you get enough calcium and vitamin D while you are taking this medicine. Discuss the foods you  eat and the vitamins you take with your healthcare professional. Tell your dentist and dental surgeon that you are taking this medicine. You should not have major dental surgery while on this medicine. See your dentist to have a dental exam and fix any dental problems before starting this medicine. Take good care of your teeth while on this medicine. Make sure you see your dentist for regular follow-up appointments. What side effects may I notice from receiving this medication? Side effects that you should report to your doctor or health care professional as soon as possible: allergic reactions like skin rash, itching or hives, swelling of the face, lips, or tongue bone pain chest pain or chest tightness jaw pain, especially after dental work signs and symptoms of a stroke like changes in vision; confusion; trouble speaking or understanding; severe headaches; sudden numbness or weakness of the face, arm or leg; trouble walking; dizziness; loss of balance or coordination signs and symptoms of low calcium like fast heartbeat; muscle cramps; muscle pain; pain, tingling, numbness in the hands or feet; seizures Side effects that usually do not require medical attention (report these to your doctor or health care professional if they continue or are bothersome): headache joint pain pain, redness, or irritation at site where injected pain, tingling, numbness in the hands or feet swelling of ankles, feet, hands trouble sleeping This list may not describe all possible side effects. Call your doctor for medical advice about  side effects. You may report side effects to FDA at 1-800-FDA-1088. Where should I keep my medication? This medicine is given in a hospital or clinic and will not be stored at home. NOTE: This sheet is a summary. It may not cover all possible information. If you have questions about this medicine, talk to your doctor, pharmacist, or health care provider.  2022 Elsevier/Gold Standard (2017-11-15 00:00:00)  Zoledronic Acid Injection (Paget's Disease, Osteoporosis) What is this medication? ZOLEDRONIC ACID (ZOE le dron ik AS id) slows calcium loss from bones. It treats Paget's disease and osteoporosis. It may be used in other people at risk for bone loss. This medicine may be used for other purposes; ask your health care provider or pharmacist if you have questions. COMMON BRAND NAME(S): Reclast, Zometa, Zometa Powder What should I tell my care team before I take this medication? They need to know if you have any of these conditions: bleeding disorder cancer dental disease kidney disease low levels of calcium in the blood low red blood cell counts lung or breathing disease (asthma) receiving steroids like dexamethasone or prednisone an unusual or allergic reaction to zoledronic acid, other medicines, foods, dyes, or preservatives pregnant or trying to get pregnant breast-feeding How should I use this medication? This drug is injected into a vein. It is given by a health care provider in a hospital or clinic setting. A special MedGuide will be given to you before each treatment. Be sure to read this information carefully each time. Talk to your health care provider about the use of this drug in children. Special care may be needed. Overdosage: If you think you have taken too much of this medicine contact a poison control center or emergency room at once. NOTE: This medicine is only for you. Do not share this medicine with others. What if I miss a dose? Keep appointments for follow-up  doses. It is important not to miss your dose. Call your health care provider if you are unable to keep an appointment. What may interact with this medication?  certain antibiotics given by injection NSAIDs, medicines for pain and inflammation, like ibuprofen or naproxen some diuretics like bumetanide, furosemide teriparatide This list may not describe all possible interactions. Give your health care provider a list of all the medicines, herbs, non-prescription drugs, or dietary supplements you use. Also tell them if you smoke, drink alcohol, or use illegal drugs. Some items may interact with your medicine. What should I watch for while using this medication? Visit your health care provider for regular checks on your progress. It may be some time before you see the benefit from this drug. Some people who take this drug have severe bone, joint, or muscle pain. This drug may also increase your risk for jaw problems or a broken thigh bone. Tell your health care provider right away if you have severe pain in your jaw, bones, joints, or muscles. Tell you health care provider if you have any pain that does not go away or that gets worse. You should make sure you get enough calcium and vitamin D while you are taking this drug. Discuss the foods you eat and the vitamins you take with your health care provider. You may need blood work done while you are taking this drug. Tell your dentist and dental surgeon that you are taking this drug. You should not have major dental surgery while on this drug. See your dentist to have a dental exam and fix any dental problems before starting this drug. Take good care of your teeth while on this drug. Make sure you see your dentist for regular follow-up appointments. What side effects may I notice from receiving this medication? Side effects that you should report to your doctor or health care provider as soon as possible: allergic reactions (skin rash, itching or hives;  swelling of the face, lips, or tongue) bone pain infection (fever, chills, cough, sore throat, pain or trouble passing urine) jaw pain, especially after dental work joint pain kidney injury (trouble passing urine or change in the amount of urine) low calcium levels (fast heartbeat; muscle cramps or pain; pain, tingling, or numbness in the hands or feet; seizures) low red blood cell counts (trouble breathing; feeling faint; lightheaded, falls; unusually weak or tired) muscle pain palpitations redness, blistering, peeling, or loosening of the skin, including inside the mouth Side effects that usually do not require medical attention (report to your doctor or health care provider if they continue or are bothersome): diarrhea eye irritation, itching, or pain fever general ill feeling or flu-like symptoms headache increase in blood pressure nausea pain, redness, or irritation at site where injected stomach pain upset stomach This list may not describe all possible side effects. Call your doctor for medical advice about side effects. You may report side effects to FDA at 1-800-FDA-1088. Where should I keep my medication? This drug is given in a hospital or clinic. It will not be stored at home. NOTE: This sheet is a summary. It may not cover all possible information. If you have questions about this medicine, talk to your doctor, pharmacist, or health care provider.  2022 Elsevier/Gold Standard (2021-04-10 00:00:00)

## 2021-09-18 DIAGNOSIS — J358 Other chronic diseases of tonsils and adenoids: Secondary | ICD-10-CM | POA: Diagnosis not present

## 2021-09-24 ENCOUNTER — Other Ambulatory Visit: Payer: Self-pay

## 2021-09-24 ENCOUNTER — Telehealth: Payer: Self-pay | Admitting: Internal Medicine

## 2021-09-24 ENCOUNTER — Other Ambulatory Visit (INDEPENDENT_AMBULATORY_CARE_PROVIDER_SITE_OTHER): Payer: 59

## 2021-09-24 DIAGNOSIS — R7303 Prediabetes: Secondary | ICD-10-CM

## 2021-09-24 DIAGNOSIS — Z0000 Encounter for general adult medical examination without abnormal findings: Secondary | ICD-10-CM

## 2021-09-24 DIAGNOSIS — Z0184 Encounter for antibody response examination: Secondary | ICD-10-CM | POA: Diagnosis not present

## 2021-09-24 DIAGNOSIS — Z1329 Encounter for screening for other suspected endocrine disorder: Secondary | ICD-10-CM

## 2021-09-24 DIAGNOSIS — Z1389 Encounter for screening for other disorder: Secondary | ICD-10-CM

## 2021-09-24 DIAGNOSIS — E785 Hyperlipidemia, unspecified: Secondary | ICD-10-CM

## 2021-09-24 MED ORDER — ROSUVASTATIN CALCIUM 5 MG PO TABS
ORAL_TABLET | Freq: Every evening | ORAL | 3 refills | Status: DC
  Filled 2021-09-24: qty 90, 90d supply, fill #0
  Filled 2022-01-24: qty 90, 90d supply, fill #1
  Filled 2022-04-20: qty 90, 90d supply, fill #2
  Filled 2022-07-23: qty 90, 90d supply, fill #3

## 2021-09-24 NOTE — Telephone Encounter (Signed)
Medication sent in to preferred pharmacy per protocol.   

## 2021-09-24 NOTE — Telephone Encounter (Signed)
Patient is requesting a refill on her rosuvastatin (CRESTOR) 5 MG tablet (Expired)

## 2021-09-25 LAB — HEMOGLOBIN A1C
Hgb A1c MFr Bld: 5.7 % of total Hgb — ABNORMAL HIGH (ref <5.7)
Mean Plasma Glucose: 117 mg/dL
eAG (mmol/L): 6.5 mmol/L

## 2021-09-25 LAB — TSH: TSH: 0.9 mIU/L (ref 0.40–4.50)

## 2021-09-25 LAB — COMPREHENSIVE METABOLIC PANEL
AG Ratio: 1.5 (calc) (ref 1.0–2.5)
ALT: 15 U/L (ref 6–29)
AST: 21 U/L (ref 10–35)
Albumin: 4.2 g/dL (ref 3.6–5.1)
Alkaline phosphatase (APISO): 57 U/L (ref 37–153)
BUN: 9 mg/dL (ref 7–25)
CO2: 27 mmol/L (ref 20–32)
Calcium: 9.4 mg/dL (ref 8.6–10.4)
Chloride: 103 mmol/L (ref 98–110)
Creat: 0.64 mg/dL (ref 0.50–1.05)
Globulin: 2.8 g/dL (calc) (ref 1.9–3.7)
Glucose, Bld: 86 mg/dL (ref 65–99)
Potassium: 4.5 mmol/L (ref 3.5–5.3)
Sodium: 140 mmol/L (ref 135–146)
Total Bilirubin: 1 mg/dL (ref 0.2–1.2)
Total Protein: 7 g/dL (ref 6.1–8.1)

## 2021-09-25 LAB — CBC WITH DIFFERENTIAL/PLATELET
Absolute Monocytes: 288 cells/uL (ref 200–950)
Basophils Absolute: 30 cells/uL (ref 0–200)
Basophils Relative: 0.5 %
Eosinophils Absolute: 60 cells/uL (ref 15–500)
Eosinophils Relative: 1 %
HCT: 37.1 % (ref 35.0–45.0)
Hemoglobin: 12.4 g/dL (ref 11.7–15.5)
Lymphs Abs: 1962 cells/uL (ref 850–3900)
MCH: 29.5 pg (ref 27.0–33.0)
MCHC: 33.4 g/dL (ref 32.0–36.0)
MCV: 88.3 fL (ref 80.0–100.0)
MPV: 13.8 fL — ABNORMAL HIGH (ref 7.5–12.5)
Monocytes Relative: 4.8 %
Neutro Abs: 3660 cells/uL (ref 1500–7800)
Neutrophils Relative %: 61 %
Platelets: 167 10*3/uL (ref 140–400)
RBC: 4.2 10*6/uL (ref 3.80–5.10)
RDW: 12.7 % (ref 11.0–15.0)
Total Lymphocyte: 32.7 %
WBC: 6 10*3/uL (ref 3.8–10.8)

## 2021-09-25 LAB — URINALYSIS, ROUTINE W REFLEX MICROSCOPIC
Bilirubin Urine: NEGATIVE
Glucose, UA: NEGATIVE
Hgb urine dipstick: NEGATIVE
Ketones, ur: NEGATIVE
Leukocytes,Ua: NEGATIVE
Nitrite: NEGATIVE
Protein, ur: NEGATIVE
Specific Gravity, Urine: 1.002 (ref 1.001–1.035)
pH: 6.5 (ref 5.0–8.0)

## 2021-09-25 LAB — LIPID PANEL
Cholesterol: 165 mg/dL (ref <200)
HDL: 66 mg/dL (ref >=50)
LDL Cholesterol (Calc): 82 mg/dL (calc)
Non-HDL Cholesterol (Calc): 99 mg/dL (calc) (ref <130)
Total CHOL/HDL Ratio: 2.5 (calc) (ref <5.0)
Triglycerides: 89 mg/dL (ref <150)

## 2021-09-25 LAB — HEPATITIS B SURFACE ANTIBODY, QUANTITATIVE: Hep B S AB Quant (Post): 17 m[IU]/mL (ref >=10)

## 2021-09-27 ENCOUNTER — Telehealth: Payer: Self-pay

## 2021-09-27 DIAGNOSIS — H40003 Preglaucoma, unspecified, bilateral: Secondary | ICD-10-CM | POA: Diagnosis not present

## 2021-09-27 NOTE — Telephone Encounter (Signed)
Lvm for pt to return call in regards to labs.  ?

## 2021-09-27 NOTE — Telephone Encounter (Signed)
[employer] copy has been placed upfront for mailing to pt's home address.

## 2021-09-27 NOTE — Telephone Encounter (Signed)
Pt returning call

## 2021-10-16 ENCOUNTER — Other Ambulatory Visit: Payer: Self-pay

## 2021-10-16 ENCOUNTER — Ambulatory Visit: Payer: 59 | Admitting: Internal Medicine

## 2021-10-16 ENCOUNTER — Encounter: Payer: Self-pay | Admitting: Internal Medicine

## 2021-10-16 VITALS — BP 112/66 | HR 90 | Temp 98.3°F | Ht 59.0 in | Wt 107.8 lb

## 2021-10-16 DIAGNOSIS — E785 Hyperlipidemia, unspecified: Secondary | ICD-10-CM | POA: Diagnosis not present

## 2021-10-16 DIAGNOSIS — M8000XD Age-related osteoporosis with current pathological fracture, unspecified site, subsequent encounter for fracture with routine healing: Secondary | ICD-10-CM | POA: Diagnosis not present

## 2021-10-16 DIAGNOSIS — Z1389 Encounter for screening for other disorder: Secondary | ICD-10-CM | POA: Diagnosis not present

## 2021-10-16 DIAGNOSIS — R7303 Prediabetes: Secondary | ICD-10-CM

## 2021-10-16 DIAGNOSIS — Z1231 Encounter for screening mammogram for malignant neoplasm of breast: Secondary | ICD-10-CM | POA: Diagnosis not present

## 2021-10-16 DIAGNOSIS — Z0000 Encounter for general adult medical examination without abnormal findings: Secondary | ICD-10-CM

## 2021-10-16 DIAGNOSIS — E559 Vitamin D deficiency, unspecified: Secondary | ICD-10-CM

## 2021-10-16 DIAGNOSIS — Z1329 Encounter for screening for other suspected endocrine disorder: Secondary | ICD-10-CM

## 2021-10-16 NOTE — Progress Notes (Signed)
Chief Complaint  ?Patient presents with  ? Annual Exam  ? ?Annual ?1. 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 ?2. Bp controlled hld on crestor 5 mg qhs, prediabetes ? ?Review of Systems  ?Constitutional:  Negative for weight loss.  ?HENT:  Negative for hearing loss.   ?Eyes:  Negative for blurred vision.  ?Respiratory:  Negative for shortness of breath.   ?Cardiovascular:  Negative for chest pain.  ?Gastrointestinal:  Negative for abdominal pain and blood in stool.  ?Genitourinary:  Negative for dysuria.  ?Musculoskeletal:  Negative for falls and joint pain.  ?Skin:  Negative for rash.  ?Neurological:  Negative for headaches.  ?Psychiatric/Behavioral:  Negative for depression.   ?Past Medical History:  ?Diagnosis Date  ? Ankle pain, chronic 06/22/2015  ? Closed fracture of sacrum with routine healing 04/30/2019  ? COVID-19   ? 08/2020  ? Hyperlipidemia 07/05/2016  ? Osteoporosis with current pathological fracture 05/18/2019  ? Sacral fracture (HCC)   ? 03/23/19  ? ?Past Surgical History:  ?Procedure Laterality Date  ? APPENDECTOMY  1988  ? ?Family History  ?Problem Relation Age of Onset  ? Heart disease Mother   ? Heart disease Father   ? Stroke Paternal Grandfather   ? Hypertension Paternal Grandfather   ? Diabetes Paternal Grandfather   ? Breast cancer Neg Hx   ? ?Social History  ? ?Socioeconomic History  ? Marital status: Married  ?  Spouse name: Not on file  ? Number of children: Not on file  ? Years of education: Not on file  ? Highest education level: Not on file  ?Occupational History  ? Not on file  ?Tobacco Use  ? Smoking status: Never  ? Smokeless tobacco: Never  ?Substance and Sexual Activity  ? Alcohol use: No  ? Drug use: No  ? Sexual activity: Not on file  ?Other Topics Concern  ? Not on file  ?Social History Narrative  ? Married   ? Kids   ?  From [place] family still there   ? Nurse in ICU  ? ?Social Determinants of Health  ? ?Financial Resource Strain: Not on file  ?Food Insecurity: Not on file  ?Transportation Needs: Not on file  ?Physical Activity: Not on file  ?Stress: Not on file  ?Social Connections: Not on file  ?Intimate Partner Violence: Not on file  ? ?Current Meds  ?Medication Sig  ? HYDROcodone-acetaminophen (NORCO) 5-325 MG tablet Take 1 tablet by mouth 2 (two) times daily as needed for moderate pain.  ? rosuvastatin (CRESTOR) 5 MG tablet TAKE 1 TABLET (5 MG TOTAL) BY MOUTH EVERY EVENING  ? ?Allergies  ?Allergen Reactions  ? Fosamax [Alendronate]   ?  Nausea ?  ? ?Recent Results (from the past 2160 hour(s))  ?Comprehensive metabolic panel     Status: None  ? Collection Time: 09/24/21  9:53 AM  ?Result Value Ref Range  ? Glucose, Bld 86 65 - 99 mg/dL  ?  Comment: . ?           Fasting reference interval ?. ?  ? BUN 9 7 - 25 mg/dL  ? Creat 0.64 0.50 - 1.05 mg/dL  ? BUN/Creatinine Ratio NOT APPLICABLE 6 - 22 (calc)  ? Sodium 140 135 - 146 mmol/L  ? Potassium 4.5 3.5 - 5.3 mmol/L  ?  Chloride 103 98 - 110 mmol/L  ? CO2 27 20 - 32 mmol/L  ? Calcium 9.4 8.6 - 10.4 mg/dL  ? Total Protein 7.0 6.1 - 8.1 g/dL  ? Albumin 4.2 3.6 - 5.1 g/dL  ? Globulin 2.8 1.9 - 3.7 g/dL (calc)  ? AG Ratio 1.5 1.0 - 2.5 (calc)  ? Total Bilirubin 1.0 0.2 - 1.2 mg/dL  ? Alkaline phosphatase (APISO) 57 37 - 153 U/L  ? AST 21 10 - 35 U/L  ? ALT 15 6 - 29 U/L  ?Lipid panel     Status: None  ? Collection Time: 09/24/21  9:53 AM  ?Result Value Ref Range  ? Cholesterol 165 <200 mg/dL  ? HDL 66 > OR = 50 mg/dL  ? Triglycerides 89 <150 mg/dL  ? LDL Cholesterol (Calc) 82 mg/dL (calc)  ?  Comment: Reference range: <100 ?Marland Kitchen ?Desirable range <100 mg/dL for primary prevention;   ?<70 mg/dL for patients with CHD or diabetic patients  ?with > or = 2 CHD risk factors. ?. ?LDL-C is now calculated using the Martin-Hopkins  ?calculation, which is a validated novel method providing  ?better  accuracy than the Friedewald equation in the  ?estimation of LDL-C.  ?Horald Pollen et al. Lenox Ahr. 6301;601(09): 2061-2068  ?(http://education.[facility].com/faq/FAQ164) ?  ? Total CHOL/HDL Ratio 2.5 <5.0 (calc)  ? Non-HDL Cholesterol (Calc) 99 <323 mg/dL (calc)  ?  Comment: For patients with diabetes plus 1 major ASCVD risk  ?factor, treating to a non-HDL-C goal of <100 mg/dL  ?(LDL-C of <70 mg/dL) is considered a therapeutic  ?option. ?  ?CBC w/Diff     Status: Abnormal  ? Collection Time: 09/24/21  9:53 AM  ?Result Value Ref Range  ? WBC 6.0 3.8 - 10.8 Thousand/uL  ? RBC 4.20 3.80 - 5.10 Million/uL  ? Hemoglobin 12.4 11.7 - 15.5 g/dL  ? HCT 37.1 35.0 - 45.0 %  ? MCV 88.3 80.0 - 100.0 fL  ? MCH 29.5 27.0 - 33.0 pg  ? MCHC 33.4 32.0 - 36.0 g/dL  ? RDW 12.7 11.0 - 15.0 %  ? Platelets 167 140 - 400 Thousand/uL  ? MPV 13.8 (H) 7.5 - 12.5 fL  ? Neutro Abs 3,660 1,500 - 7,800 cells/uL  ? Lymphs Abs 1,962 850 - 3,900 cells/uL  ? Absolute Monocytes 288 200 - 950 cells/uL  ? Eosinophils Absolute 60 15 - 500 cells/uL  ? Basophils Absolute 30 0 - 200 cells/uL  ? Neutrophils Relative % 61 %  ? Total Lymphocyte 32.7 %  ? Monocytes Relative 4.8 %  ? Eosinophils Relative 1.0 %  ? Basophils Relative 0.5 %  ?TSH     Status: None  ? Collection Time: 09/24/21  9:53 AM  ?Result Value Ref Range  ? TSH 0.90 0.40 - 4.50 mIU/L  ?Hemoglobin A1c     Status: Abnormal  ? Collection Time: 09/24/21  9:53 AM  ?Result Value Ref Range  ? Hgb A1c MFr Bld 5.7 (H) <5.7 % of total Hgb  ?  Comment: For someone without known diabetes, a hemoglobin  ?A1c value between 5.7% and 6.4% is consistent with ?prediabetes and should be confirmed with a  ?follow-up test. ?. ?For someone with known diabetes, a value <7% ?indicates that their diabetes is well controlled. A1c ?targets should be individualized based on duration of ?diabetes, age, comorbid conditions, and other ?considerations. ?. ?This assay result is consistent with an increased risk ?of  diabetes. ?. ?Currently, no consensus exists regarding use of ?hemoglobin  A1c for diagnosis of diabetes for children. ?. ?  ? Mean Plasma Glucose 117 mg/dL  ? eAG (mmol/L) 6.5 mmol/L  ?Hepatitis B surface antibody,quantitative     Status: None  ? Collection Time: 09/24/21  9:53 AM  ?Result Value Ref Range  ? Hepatitis B-Post 17 > OR = 10 mIU/mL  ?  Comment: . ?Patient has immunity to hepatitis B virus. ?. ?For additional information, please refer to ?http://education.questdiagnostics.com/faq/FAQ105 ?(This link is being provided for informational/ ?educational purposes only). ?  ?Urinalysis, Routine w reflex microscopic     Status: None  ? Collection Time: 09/24/21  9:53 AM  ?Result Value Ref Range  ? Color, Urine YELLOW YELLOW  ? APPearance CLEAR CLEAR  ? Specific Gravity, Urine 1.002 1.001 - 1.035  ? pH 6.5 5.0 - 8.0  ? Glucose, UA NEGATIVE NEGATIVE  ? Bilirubin Urine NEGATIVE NEGATIVE  ? Ketones, ur NEGATIVE NEGATIVE  ? Hgb urine dipstick NEGATIVE NEGATIVE  ? Protein, ur NEGATIVE NEGATIVE  ? Nitrite NEGATIVE NEGATIVE  ? Leukocytes,Ua NEGATIVE NEGATIVE  ? ?Objective  ?Body mass index is 21.77 kg/m?. ?Wt Readings from Last 3 Encounters:  ?10/16/21 107 lb 12.8 oz (48.9 kg)  ?09/14/21 106 lb 12.8 oz (48.4 kg)  ?08/22/21 106 lb (48.1 kg)  ? ?Temp Readings from Last 3 Encounters:  ?10/16/21 98.3 ?F (36.8 ?C) (Oral)  ?09/14/21 98.1 ?F (36.7 ?C) (Oral)  ?08/22/21 (!) 96.4 ?F (35.8 ?C) (Temporal)  ? ?BP Readings from Last 3 Encounters:  ?10/16/21 112/66  ?09/14/21 128/76  ?08/22/21 132/78  ? ?Pulse Readings from Last 3 Encounters:  ?10/16/21 90  ?09/14/21 76  ?08/22/21 75  ? ? ?Physical Exam ?Vitals and nursing note reviewed.  ?Constitutional:   ?   Appearance: Normal appearance. She is well-developed and well-groomed.  ?HENT:  ?   Head: Normocephalic and atraumatic.  ?Eyes:  ?   Conjunctiva/sclera: Conjunctivae normal.  ?   Pupils: Pupils are equal, round, and reactive to light.  ?Cardiovascular:  ?   Rate and Rhythm:  Normal rate and regular rhythm.  ?   Heart sounds: Normal heart sounds. No murmur heard. ?Pulmonary:  ?   Effort: Pulmonary effort is normal.  ?   Breath sounds: Normal breath sounds.  ?Abdominal:  ?   General: Abdomen

## 2021-10-16 NOTE — Patient Instructions (Signed)
Think about colonoscopy please best test screen for colon cancer  ?[facility]  ?Let me know when ready  ? ? ?Colonoscopy, Adult, Care After ?This sheet gives you information about how to care for yourself after your procedure. Your health care provider may also give you more specific instructions. If you have problems or questions, contact your health care provider. ?What can I expect after the procedure? ?After the procedure, it is common to have: ?A small amount of blood in your stool for 24 hours after the procedure. ?Some gas. ?Mild cramping or bloating of your abdomen. ?Follow these instructions at home: ?Eating and drinking ? ?Drink enough fluid to keep your urine pale yellow. ?Follow instructions from your health care provider about eating or drinking restrictions. ?Resume your normal diet as instructed by your health care provider. Avoid heavy or fried foods that are hard to digest. ?Activity ?Rest as told by your health care provider. ?Avoid sitting for a long time without moving. Get up to take short walks every 1-2 hours. This is important to improve blood flow and breathing. Ask for help if you feel weak or unsteady. ?Return to your normal activities as told by your health care provider. Ask your health care provider what activities are safe for you. ?Managing cramping and bloating ? ?Try walking around when you have cramps or feel bloated. ?Apply heat to your abdomen as told by your health care provider. Use the heat source that your health care provider recommends, such as a moist heat pack or a heating pad. ?Place a towel between your skin and the heat source. ?Leave the heat on for 20-30 minutes. ?Remove the heat if your skin turns bright red. This is especially important if you are unable to feel pain, heat, or cold. You may have a greater risk of getting burned. ?General instructions ?If you were given a sedative during the procedure, it can affect you for several hours. Do  not drive or operate machinery until your health care provider says that it is safe. ?For the first 24 hours after the procedure: ?Do not sign important documents. ?Do not drink alcohol. ?Do your regular daily activities at a slower pace than normal. ?Eat soft foods that are easy to digest. ?Take over-the-counter and prescription medicines only as told by your health care provider. ?Keep all follow-up visits as told by your health care provider. This is important. ?Contact a health care provider if: ?You have blood in your stool 2-3 days after the procedure. ?Get help right away if you have: ?More than a small spotting of blood in your stool. ?Large blood clots in your stool. ?Swelling of your abdomen. ?Nausea or vomiting. ?A fever. ?Increasing pain in your abdomen that is not relieved with medicine. ?Summary ?After the procedure, it is common to have a small amount of blood in your stool. You may also have mild cramping and bloating of your abdomen. ?If you were given a sedative during the procedure, it can affect you for several hours. Do not drive or operate machinery until your health care provider says that it is safe. ?Get help right away if you have a lot of blood in your stool, nausea or vomiting, a fever, or increased pain in your abdomen. ?This information is not intended to replace advice given to you by your health care provider. Make sure you discuss any questions you have with your health care provider. ?Document Revised: 05/28/2019 Document Reviewed: 02/15/2019 ?Elsevier Patient Education ?  [place]. ? ?

## 2021-10-19 ENCOUNTER — Telehealth: Payer: Self-pay | Admitting: Internal Medicine

## 2021-10-19 NOTE — Telephone Encounter (Signed)
Pt called back to see if paperwork was faxed over.... Per Joanne Gavel, paperwork will be fax today... Advise pt that paperwork will be faxed over... Pt understood...  ?

## 2021-10-19 NOTE — Telephone Encounter (Signed)
Patient would like to know if her FMLA paper work has been sent to [employer]. She said that Dr French Ana was going to send on Tuesday, HR has not received paper work. Patient would like a call. ?

## 2021-10-24 ENCOUNTER — Encounter: Payer: Self-pay | Admitting: Internal Medicine

## 2021-10-24 NOTE — Telephone Encounter (Signed)
To Whom It May Concern  ? ?Mrs. Risser will be returning to work the week of 10/22/21 after rib fractures please excuse her for light duty x 2 weeks and no heavy lifting more than 5-10 pounds.  I will send paperwork to [employer] and [employer].  ? ?See letter from today 10/24/21  ? ? ? ? ?

## 2021-10-24 NOTE — Telephone Encounter (Signed)
Paperwork was faxed and a confirmation was received.

## 2021-10-24 NOTE — Telephone Encounter (Signed)
Noted, will await fax to correct the dates on Patient's paperwork. Asked that all corrections needed be noted on the cover sheet when they fax this paperwork back over  ?

## 2021-10-24 NOTE — Telephone Encounter (Signed)
Erie Noe From [employer] called requesting additional information on pt returning to work dates... Spoke with Arianna... Joanne Gavel stated to have [employer] fax over the paperwork so they can make correction... Per Erie Noe that she will fax over paperwork...  ?

## 2021-11-05 NOTE — Progress Notes (Signed)
? ? ?Name: Cynthia Zuniga  ?MRN/ DOB: [id number], [birth year]    ?Age/ Sex: 61 y.o., female   ? ?PCP: McLean-Scocuzza, Pasty Spillers, MD   ?Reason for Endocrinology Evaluation: Osteoporosis  ?   ?Date of Initial Endocrinology Evaluation: 11/06/2021   ? ? ?HPI: ?[name] is a 61 y.o. female with a past medical history of dyslipidemia and osteoporosis. The patient presented for initial endocrinology clinic visit on 11/06/2021 for consultative assistance with her osteoporosis.  ? ?Pt was diagnosed with osteoporosis: 2020 with a T score of -2.9 at the right hip ? ?Menarche at age : 24 ?Menopausal at age : 28 ?Fracture Hx: Sacral fracture (2020) fell off step ladder  , Rib fracture 08/2021 following a fall in the bath rub ?Hx of HRT: no ?FH of osteoporosis or hip fracture: unknown  ?Prior Hx of anti-estrogenic therapy :no  ?Prior Hx of anti-resorptive therapy : She tried fosamax for a couple of times but developed stomach upset in the form of heartburn  ? ? ?She is on Vitamin D 2000 iu daily  ?Calcium 1 tablet daily  ? ? ? ? ?HISTORY:  ?Past Medical History:  ?Past Medical History:  ?Diagnosis Date  ? Ankle pain, chronic 06/22/2015  ? Closed fracture of sacrum with routine healing 04/30/2019  ? COVID-19   ? 08/2020  ? Hyperlipidemia 07/05/2016  ? Osteoporosis with current pathological fracture 05/18/2019  ? Sacral fracture (HCC)   ? 03/23/19  ? ?Past Surgical History:  ?Past Surgical History:  ?Procedure Laterality Date  ? APPENDECTOMY  1988  ?  ?Social History:  reports that she has never smoked. She has never used smokeless tobacco. She reports that she does not drink alcohol and does not use drugs. ?Family History: family history includes Diabetes in her paternal grandfather; Heart disease in her father and mother; Hypertension in her paternal grandfather; Stroke in her paternal grandfather. ? ? ?HOME MEDICATIONS: ?Allergies as of 11/06/2021   ? ?   Reactions  ? Fosamax [alendronate]   ? Nausea  ? ?  ? ?  ?Medication List   ?  ? ?  ? Accurate as of November 06, 2021 12:10 PM. If you have any questions, ask your nurse or doctor.  ?  ?  ? ?  ? ?STOP taking these medications   ? ?HYDROcodone-acetaminophen 5-325 MG tablet ?Commonly known as: Norco ?Stopped by: Scarlette Shorts, MD ?  ? ?  ? ?TAKE these medications   ? ?alendronate 70 MG tablet ?Commonly known as: Fosamax ?Take 1 tablet (70 mg total) by mouth once a week. Take with a full glass of water on an empty stomach. ?Started by: Scarlette Shorts, MD ?  ?rosuvastatin 5 MG tablet ?Commonly known as: CRESTOR ?TAKE 1 TABLET (5 MG TOTAL) BY MOUTH EVERY EVENING ?  ? ?  ?  ? ? ?REVIEW OF SYSTEMS: ?A comprehensive ROS was conducted with the patient and is negative except as per HPI   ? ? ? ?OBJECTIVE:  ?VS: BP 110/68 (BP Location: Left Arm, Patient Position: Sitting, Cuff Size: Small)   Pulse 69   Ht 4\' 11"  (1.499 m)   Wt 107 lb 6.4 oz (48.7 kg)   LMP 05/12/2015   SpO2 97%   BMI 21.69 kg/m?   ? ?Wt Readings from Last 3 Encounters:  ?11/06/21 107 lb 6.4 oz (48.7 kg)  ?10/16/21 107 lb 12.8 oz (48.9 kg)  ?09/14/21 106 lb 12.8 oz (48.4 kg)  ? ? ? ?EXAM: ?  General: Pt appears well and is in NAD  ?Neck: General: Supple without adenopathy. ?Thyroid: Thyroid size normal.  No goiter or nodules appreciated.   ?Lungs: Clear with good BS bilat with no rales, rhonchi, or wheezes  ?Heart: Auscultation: RRR.  ?Abdomen: Normoactive bowel sounds, soft, nontender, without masses or organomegaly palpable  ?Extremities:  ?BL LE: No pretibial edema normal ROM and strength.  ?Mental Status: Judgment, insight: Intact ?Orientation: Oriented to time, place, and person ?Mood and affect: No depression, anxiety, or agitation  ? ? ? ?DATA REVIEWED: ? ?  ? Latest Reference Range & Units 09/24/21 09:53  ?Sodium 135 - 146 mmol/L 140  ?Potassium 3.5 - 5.3 mmol/L 4.5  ?Chloride 98 - 110 mmol/L 103  ?CO2 20 - 32 mmol/L 27  ?Glucose 65 - 99 mg/dL 86  ?Mean Plasma Glucose mg/dL 400  ?BUN 7 - 25 mg/dL 9  ?Creatinine  0.50 - 1.05 mg/dL 8.67  ?Calcium 8.6 - 10.4 mg/dL 9.4  ?BUN/Creatinine Ratio 6 - 22 (calc) NOT APPLICABLE  ?AG Ratio 1.0 - 2.5 (calc) 1.5  ?AST 10 - 35 U/L 21  ?ALT 6 - 29 U/L 15  ?Total Protein 6.1 - 8.1 g/dL 7.0  ?Total Bilirubin 0.2 - 1.2 mg/dL 1.0  ? ? Latest Reference Range & Units 09/24/21 09:53  ?TSH 0.40 - 4.50 mIU/L 0.90  ? ? ?DXA 05/07/2019 ? ?The BMD measured at Femur Neck Right is 0.637 g/cm2 with a T-score ?of -2.9. This patient is considered osteoporotic according to World ?Health Organization [facility]) criteria. The scan quality is good. ?  ?Site Region Measured Measured WHO Young Adult BMD ?Date       Age      Classification T-score ?AP Spine L1-L4 05/07/2019 60.3 Osteopenia -2.0 0.950 g/cm2 ?  ?DualFemur Neck Right 05/07/2019 60.3 Osteoporosis -2.9 0.637 g/cm2 ?  ?DualFemur Total Mean 05/07/2019 60.3 Osteopenia -1.9 0.771 g/cm2 ?  ?Left Forearm Radius 33% 05/07/2019 60.3 Normal -0.8 0.806 g/cm2 ?  ? ?ASSESSMENT/PLAN/RECOMMENDATIONS:  ? ?Osteoporosis: ? ?-Patient with hx sacral and rib fractures that are NOT considered fragility fractures ?-Emphasized the importance of optimizing calcium and vitamin D intake ?-Encouraged weightbearing exercises ?-She has tried alendronate in the past but was not taking it appropriately and developed heartburn.  Today we discussed the importance of taking alendronate on an empty stomach with a full glass of water, waiting 30 minutes before eating and in upright position ?-Patient is willing to give alendronate another try ?-She was also scheduled for an updated bone density at [facility] for 01/02/2022 at 11 AM ? ?Medications : ?Alendronate 70 mg weekly ?Calcium 1200 mg daily ?Vitamin D 2000 IU daily ? ?Signed electronically by: ?Abby Raelyn Mora, MD ? ?[facility]  ?[facility] ?[address] ?[place], [place] [address] ?Phone: [phone] ?FAX: [phone] ? ? ?CC: ?McLean-Scocuzza, Pasty Spillers, MD ?[address] ?[place] [address] ?Phone: [phone] ?Fax: [phone] ? ? ?Return to Endocrinology clinic as below: ?Future Appointments  ?Date Time Provider Department Center  ?01/02/2022 11:00 AM [facility]  ?05/07/2022 10:10 AM Shiquita Collignon, Konrad Dolores, MD [facility] None  ?  ? ? ? ? ? ?

## 2021-11-06 ENCOUNTER — Ambulatory Visit: Payer: 59 | Admitting: Internal Medicine

## 2021-11-06 ENCOUNTER — Encounter: Payer: Self-pay | Admitting: Internal Medicine

## 2021-11-06 ENCOUNTER — Other Ambulatory Visit: Payer: Self-pay

## 2021-11-06 VITALS — BP 110/68 | HR 69 | Ht 59.0 in | Wt 107.4 lb

## 2021-11-06 DIAGNOSIS — M810 Age-related osteoporosis without current pathological fracture: Secondary | ICD-10-CM | POA: Diagnosis not present

## 2021-11-06 MED ORDER — ALENDRONATE SODIUM 70 MG PO TABS
70.0000 mg | ORAL_TABLET | ORAL | 3 refills | Status: DC
  Filled 2021-11-06: qty 12, 84d supply, fill #0
  Filled 2022-04-20: qty 12, 84d supply, fill #1

## 2021-11-06 NOTE — Patient Instructions (Signed)
Start Fosamax 70 mg weekly  ?Calcium 1200 mg daily  ?Vitamin D 2000 iu daily  ? ?Weight bearing  exercise  ? ? ?[facility] for bone density [phone] ?

## 2022-01-02 ENCOUNTER — Other Ambulatory Visit: Payer: 59

## 2022-01-10 ENCOUNTER — Other Ambulatory Visit: Payer: 59

## 2022-01-24 ENCOUNTER — Other Ambulatory Visit: Payer: Self-pay

## 2022-03-05 ENCOUNTER — Other Ambulatory Visit: Payer: 59

## 2022-03-19 DIAGNOSIS — J358 Other chronic diseases of tonsils and adenoids: Secondary | ICD-10-CM | POA: Diagnosis not present

## 2022-03-29 ENCOUNTER — Ambulatory Visit
Admission: RE | Admit: 2022-03-29 | Discharge: 2022-03-29 | Disposition: A | Discharge status: Home / Self Care | Payer: 59 | Source: Ambulatory Visit | Attending: Internal Medicine | Admitting: Internal Medicine

## 2022-03-29 DIAGNOSIS — M810 Age-related osteoporosis without current pathological fracture: Secondary | ICD-10-CM | POA: Insufficient documentation

## 2022-03-29 DIAGNOSIS — Z780 Asymptomatic menopausal state: Secondary | ICD-10-CM | POA: Diagnosis not present

## 2022-03-29 DIAGNOSIS — M8588 Other specified disorders of bone density and structure, other site: Secondary | ICD-10-CM | POA: Diagnosis not present

## 2022-04-21 ENCOUNTER — Other Ambulatory Visit: Payer: Self-pay

## 2022-04-22 ENCOUNTER — Other Ambulatory Visit: Payer: Self-pay

## 2022-05-07 ENCOUNTER — Other Ambulatory Visit: Payer: Self-pay

## 2022-05-07 ENCOUNTER — Encounter: Payer: Self-pay | Admitting: Internal Medicine

## 2022-05-07 ENCOUNTER — Ambulatory Visit: Payer: 59 | Admitting: Internal Medicine

## 2022-05-07 VITALS — BP 120/70 | HR 78 | Ht 59.0 in | Wt 109.6 lb

## 2022-05-07 DIAGNOSIS — M810 Age-related osteoporosis without current pathological fracture: Secondary | ICD-10-CM | POA: Diagnosis not present

## 2022-05-07 MED ORDER — ALENDRONATE SODIUM 70 MG PO TABS
70.0000 mg | ORAL_TABLET | ORAL | 3 refills | Status: DC
  Filled 2022-05-07: qty 13, 91d supply, fill #0
  Filled 2022-07-23: qty 4, 28d supply, fill #0
  Filled 2022-08-28: qty 4, 28d supply, fill #1
  Filled 2022-09-25: qty 4, 28d supply, fill #2
  Filled 2023-02-01: qty 4, 28d supply, fill #3
  Filled 2023-03-04: qty 4, 28d supply, fill #4
  Filled 2023-03-05: qty 12, 84d supply, fill #4
  Filled 2023-05-04: qty 12, 84d supply, fill #5

## 2022-05-07 NOTE — Patient Instructions (Signed)
Continue Fosamax 70 mg weekly  Calcium 1200 mg daily  Vitamin D 2000 iu daily   Please start Weight bearing  exercise

## 2022-05-07 NOTE — Progress Notes (Signed)
Name: Cynthia Zuniga  MRN/ DOB: [id number], 12-05-1960    Age/ Sex: 61 y.o., female    PCP: McLean-Scocuzza, Pasty Spillers, MD   Reason for Endocrinology Evaluation: Osteoporosis     Date of Initial Endocrinology Evaluation: 11/06/2021    HPI: [name] is a 61 y.o. female with a past medical history of dyslipidemia and osteoporosis. The patient presented for initial endocrinology clinic visit on 11/06/2021 for consultative assistance with her osteoporosis.   Pt was diagnosed with osteoporosis: 2020 with a T score of -2.9 at the right hip  Menarche at age : 11 Menopausal at age : 62 Fracture Hx: Sacral fracture (2020) fell off step ladder  , Rib fracture 08/2021 following a fall in the bath tub Hx of HRT: no FH of osteoporosis or hip fracture: unknown  Prior Hx of anti-estrogenic therapy :no  Prior Hx of anti-resorptive therapy : She tried fosamax for a couple of times but developed stomach upset in the form of heartburn   Restarted Alendronate 11/2021 without side effects  SUBJECTIVE:    Today (05/07/22): [name] is here for follow-up on osteoporosis management.  Denies heartburn  Denies recent falls or bone fracture  Denies constipation  Denies joint pain   DXA August?    Alendronate 70 mg weekly Calcium 1200 mg daily Vitamin D 2000 IU daily    HISTORY:  Past Medical History:  Past Medical History:  Diagnosis Date   Ankle pain, chronic 06/22/2015   Closed fracture of sacrum with routine healing 04/30/2019   COVID-19    08/2020   Hyperlipidemia 07/05/2016   Osteoporosis with current pathological fracture 05/18/2019   Sacral fracture (HCC)    03/23/19   Past Surgical History:  Past Surgical History:  Procedure Laterality Date   APPENDECTOMY  1988    Social History:  reports that she has never smoked. She has never used smokeless tobacco. She reports that she does not drink alcohol and does not use drugs. Family History: family history includes  Diabetes in her paternal grandfather; Heart disease in her father and mother; Hypertension in her paternal grandfather; Stroke in her paternal grandfather.   HOME MEDICATIONS: Allergies as of 05/07/2022       Reactions   Fosamax [alendronate]    Nausea        Medication List        Accurate as of May 07, 2022  9:58 AM. If you have any questions, ask your nurse or doctor.          alendronate 70 MG tablet Commonly known as: Fosamax Take 1 tablet (70 mg total) by mouth once a week. Take with a full glass of water on an empty stomach.   rosuvastatin 5 MG tablet Commonly known as: CRESTOR TAKE 1 TABLET (5 MG TOTAL) BY MOUTH EVERY EVENING          REVIEW OF SYSTEMS: A comprehensive ROS was conducted with the patient and is negative except as per HPI      OBJECTIVE:  VS: BP 120/70 (BP Location: Left Arm, Patient Position: Sitting, Cuff Size: Small)   Pulse 78   Ht 4\' 11"  (1.499 m)   Wt 109 lb 9.6 oz (49.7 kg)   LMP 05/12/2015   SpO2 97%   BMI 22.14 kg/m    Wt Readings from Last 3 Encounters:  05/07/22 109 lb 9.6 oz (49.7 kg)  11/06/21 107 lb 6.4 oz (48.7 kg)  10/16/21 107 lb 12.8 oz (48.9 kg)  EXAM: General: Pt appears well and is in NAD  Neck: General: Supple without adenopathy. Thyroid: Thyroid size normal.  No goiter or nodules appreciated.   Lungs: Clear with good BS bilat with no rales, rhonchi, or wheezes  Heart: Auscultation: RRR.  Abdomen: Normoactive bowel sounds, soft, nontender, without masses or organomegaly palpable  Extremities:  BL LE: No pretibial edema normal ROM and strength.  Mental Status: Judgment, insight: Intact Orientation: Oriented to time, place, and person Mood and affect: No depression, anxiety, or agitation     DATA REVIEWED:     Latest Reference Range & Units 09/24/21 09:53  Sodium 135 - 146 mmol/L 140  Potassium 3.5 - 5.3 mmol/L 4.5  Chloride 98 - 110 mmol/L 103  CO2 20 - 32 mmol/L 27  Glucose 65 - 99 mg/dL  86  Mean Plasma Glucose mg/dL 117  BUN 7 - 25 mg/dL 9  Creatinine 0.50 - 1.05 mg/dL 0.64  Calcium 8.6 - 10.4 mg/dL 9.4  BUN/Creatinine Ratio 6 - 22 (calc) NOT APPLICABLE  AG Ratio 1.0 - 2.5 (calc) 1.5  AST 10 - 35 U/L 21  ALT 6 - 29 U/L 15  Total Protein 6.1 - 8.1 g/dL 7.0  Total Bilirubin 0.2 - 1.2 mg/dL 1.0    Latest Reference Range & Units 09/24/21 09:53  TSH 0.40 - 4.50 mIU/L 0.90    DXA 03/2022   DENSITOMETRY RESULTS: Site      Region     Measured Date Measured Age WHO Classification Young Adult T-score BMD         %Change vs. Previous Significant Change (*) DualFemur Neck Left 03/29/2022 63.2 Osteoporosis -2.6 0.678 g/cm2 -1.2% - DualFemur Neck Left 05/07/2019 60.3 Osteoporosis -2.5 0.686 g/cm2 - -   DualFemur Total Mean 03/29/2022 63.2 Osteopenia -1.8 0.781 g/cm2 1.3% - DualFemur Total Mean 05/07/2019 60.3 Osteopenia -1.9 0.771 g/cm2 - -   AP Spine L1-L4 03/29/2022 63.2 Osteopenia -1.7 0.983 g/cm2 2.8% - AP Spine L1-L4 05/07/2019 60.3 Osteopenia -1.9 0.956 g/cm2 - -     ASSESSMENT/PLAN/RECOMMENDATIONS:   Osteoporosis:  -Patient with hx sacral and rib fractures that are NOT considered fragility fractures -Emphasized the importance of optimizing calcium and vitamin D intake as well as weightbearing exercises -She has tried alendronate in the past but was not taking it appropriately and developed heartburn.  At this time she is tolerating it very well. -Most recent DXA scan 03/2022 continues to show osteoporosis  Medications : Alendronate 70 mg weekly Calcium 1200 mg daily Vitamin D 2000 IU daily  Signed electronically by: Mack Guise, MD  [facility] Endocrinology  [facility] [address], [place] [address] Phone: [phone] FAX: [phone]   CC: McLean-Scocuzza, Nino Glow, MD [place] [address] Phone: [phone] Fax: [phone]   Return to Endocrinology clinic as  below: Future Appointments  Date Time Provider [place]  05/07/2022 10:10 AM Daxton Nydam, Melanie Crazier, MD [facility] None  08/28/2022  9:00 AM Carollee Leitz, MD [facility]

## 2022-06-24 DIAGNOSIS — H0100A Unspecified blepharitis right eye, upper and lower eyelids: Secondary | ICD-10-CM | POA: Diagnosis not present

## 2022-07-23 ENCOUNTER — Other Ambulatory Visit: Payer: Self-pay

## 2022-07-24 ENCOUNTER — Encounter: Payer: 59 | Admitting: Family Medicine

## 2022-07-24 ENCOUNTER — Other Ambulatory Visit: Payer: Self-pay

## 2022-08-28 ENCOUNTER — Encounter: Payer: 59 | Admitting: Family Medicine

## 2022-08-29 ENCOUNTER — Other Ambulatory Visit: Payer: Self-pay

## 2022-11-04 ENCOUNTER — Telehealth: Payer: Self-pay | Admitting: Internal Medicine

## 2022-11-04 NOTE — Telephone Encounter (Signed)
Pt need a refill on rosuvastatin sent to [place]

## 2022-11-06 ENCOUNTER — Ambulatory Visit: Payer: Commercial Managed Care - PPO | Admitting: Nurse Practitioner

## 2022-11-06 ENCOUNTER — Other Ambulatory Visit: Payer: Self-pay

## 2022-11-06 ENCOUNTER — Encounter: Payer: Self-pay | Admitting: Nurse Practitioner

## 2022-11-06 VITALS — BP 122/60 | HR 79 | Temp 97.8°F | Ht 59.0 in | Wt 109.6 lb

## 2022-11-06 DIAGNOSIS — E785 Hyperlipidemia, unspecified: Secondary | ICD-10-CM

## 2022-11-06 DIAGNOSIS — Z1329 Encounter for screening for other suspected endocrine disorder: Secondary | ICD-10-CM | POA: Diagnosis not present

## 2022-11-06 DIAGNOSIS — R7303 Prediabetes: Secondary | ICD-10-CM | POA: Diagnosis not present

## 2022-11-06 DIAGNOSIS — E559 Vitamin D deficiency, unspecified: Secondary | ICD-10-CM

## 2022-11-06 DIAGNOSIS — M810 Age-related osteoporosis without current pathological fracture: Secondary | ICD-10-CM | POA: Diagnosis not present

## 2022-11-06 LAB — CBC WITH DIFFERENTIAL/PLATELET
Basophils Absolute: 0.1 10*3/uL (ref 0.0–0.1)
Basophils Relative: 0.7 % (ref 0.0–3.0)
Eosinophils Absolute: 0.1 10*3/uL (ref 0.0–0.7)
Eosinophils Relative: 1.3 % (ref 0.0–5.0)
HCT: 37.2 % (ref 36.0–46.0)
Hemoglobin: 12.7 g/dL (ref 12.0–15.0)
Lymphocytes Relative: 27 % (ref 12.0–46.0)
Lymphs Abs: 2.1 10*3/uL (ref 0.7–4.0)
MCHC: 34.1 g/dL (ref 30.0–36.0)
MCV: 87.6 fl (ref 78.0–100.0)
Monocytes Absolute: 0.5 10*3/uL (ref 0.1–1.0)
Monocytes Relative: 6.2 % (ref 3.0–12.0)
Neutro Abs: 5.1 10*3/uL (ref 1.4–7.7)
Neutrophils Relative %: 64.8 % (ref 43.0–77.0)
Platelets: 187 10*3/uL (ref 150.0–400.0)
RBC: 4.25 Mil/uL (ref 3.87–5.11)
RDW: 13.2 % (ref 11.5–15.5)
WBC: 7.9 10*3/uL (ref 4.0–10.5)

## 2022-11-06 LAB — LIPID PANEL
Cholesterol: 188 mg/dL (ref 0–200)
HDL: 75.4 mg/dL (ref >39.00)
LDL Cholesterol: 100 mg/dL — ABNORMAL HIGH (ref 0–99)
NonHDL: 112.55
Total CHOL/HDL Ratio: 2
Triglycerides: 64 mg/dL (ref 0.0–149.0)
VLDL: 12.8 mg/dL (ref 0.0–40.0)

## 2022-11-06 LAB — COMPREHENSIVE METABOLIC PANEL
ALT: 19 U/L (ref 0–35)
AST: 24 U/L (ref 0–37)
Albumin: 4.4 g/dL (ref 3.5–5.2)
Alkaline Phosphatase: 59 U/L (ref 39–117)
BUN: 13 mg/dL (ref 6–23)
CO2: 26 mEq/L (ref 19–32)
Calcium: 9.5 mg/dL (ref 8.4–10.5)
Chloride: 105 mEq/L (ref 96–112)
Creatinine, Ser: 0.76 mg/dL (ref 0.40–1.20)
GFR: 83.11 mL/min (ref >60.00)
Glucose, Bld: 95 mg/dL (ref 70–99)
Potassium: 4 mEq/L (ref 3.5–5.1)
Sodium: 138 mEq/L (ref 135–145)
Total Bilirubin: 0.8 mg/dL (ref 0.2–1.2)
Total Protein: 7.1 g/dL (ref 6.0–8.3)

## 2022-11-06 LAB — TSH: TSH: 1.59 u[IU]/mL (ref 0.35–5.50)

## 2022-11-06 LAB — VITAMIN D 25 HYDROXY (VIT D DEFICIENCY, FRACTURES): VITD: 40.54 ng/mL (ref 30.00–100.00)

## 2022-11-06 LAB — HEMOGLOBIN A1C: Hgb A1c MFr Bld: 6 % (ref 4.6–6.5)

## 2022-11-06 MED ORDER — ROSUVASTATIN CALCIUM 5 MG PO TABS
5.0000 mg | ORAL_TABLET | Freq: Every evening | ORAL | 3 refills | Status: DC
Start: 2022-11-06 — End: 2023-11-03
  Filled 2022-11-06: qty 90, 90d supply, fill #0
  Filled 2023-02-01: qty 90, 90d supply, fill #1
  Filled 2023-05-04: qty 90, 90d supply, fill #2
  Filled 2023-08-11: qty 90, 90d supply, fill #3

## 2022-11-06 NOTE — Assessment & Plan Note (Signed)
Taking Fosamax 70 mg weekly, continue. Continue calcium and vitamin D daily supplements. Encouraged weightbearing exercises. Follow up with Endocrinology.

## 2022-11-06 NOTE — Assessment & Plan Note (Signed)
Chronic. Stable with diet control. Continue. Will check A1c today. Encouraged healthy diet and exercise.

## 2022-11-06 NOTE — Assessment & Plan Note (Signed)
Chronic. Continue daily supplement. Will check vitamin D level today.

## 2022-11-06 NOTE — Progress Notes (Signed)
Tomasita Morrow, NP-C Phone: [phone]  Cynthia Zuniga is a 62 y.o. female who presents today for medication refill and lab work. She has no complaints or new concerns. She is doing well on her medications. She has a transfer of care appointment with Dr. Volanda Napoleon coming up in May but will need her medication before then.  HYPERLIPIDEMIA Symptoms Chest pain on exertion:  No   Leg claudication:   No Medications: Compliance- Crestor 5 mg Right upper quadrant pain- No  Muscle aches- No Lipid Panel     Component Value Date/Time   CHOL 165 09/24/2021 0953   TRIG 89 09/24/2021 0953   HDL 66 09/24/2021 0953   CHOLHDL 2.5 09/24/2021 0953   VLDL 16.8 07/17/2020 0917   LDLCALC 82 09/24/2021 0953   LDLDIRECT 153.0 09/25/2015 1120   PREDIABETES Disease Monitoring: Blood Sugar ranges- Not checking Polyuria/phagia/dipsia- No      Optho- No Medications: Compliance- Diet controlled Hypoglycemic symptoms- No Lab Results  Component Value Date   HGBA1C 5.7 (H) 09/24/2021   Osteoporosis- Currently followed by Endocrinology. Taking Fosamax 70 mg weekly, calcium and vitamin D supplements daily. Doing well. No new fractures.   Social History   Tobacco Use  Smoking Status Never  Smokeless Tobacco Never    Current Outpatient Medications on File Prior to Visit  Medication Sig Dispense Refill   alendronate (FOSAMAX) 70 MG tablet Take 1 tablet (70 mg total) by mouth once a week. Take with a full glass of water on an empty stomach. 13 tablet 3   No current facility-administered medications on file prior to visit.    ROS see history of present illness  Objective  Physical Exam Vitals:   11/06/22 0807  BP: 122/60  Pulse: 79  Temp: 97.8 F (36.6 C)  SpO2: 96%    BP Readings from Last 3 Encounters:  11/06/22 122/60  05/07/22 120/70  11/06/21 110/68   Wt Readings from Last 3 Encounters:  11/06/22 109 lb 9.6 oz (49.7 kg)  05/07/22 109 lb 9.6 oz (49.7 kg)  11/06/21 107 lb 6.4 oz (48.7  kg)    Physical Exam Constitutional:      General: She is not in acute distress.    Appearance: Normal appearance.  HENT:     Head: Normocephalic.  Cardiovascular:     Rate and Rhythm: Normal rate and regular rhythm.     Heart sounds: Normal heart sounds.  Pulmonary:     Effort: Pulmonary effort is normal.     Breath sounds: Normal breath sounds.  Skin:    General: Skin is warm and dry.  Neurological:     General: No focal deficit present.     Mental Status: She is alert.  Psychiatric:        Mood and Affect: Mood normal.        Behavior: Behavior normal.    Assessment/Plan: Please see individual problem list.  Hyperlipidemia, unspecified hyperlipidemia type Assessment & Plan: Chronic. Stable on Crestor 5 mg daily. Continue. Refills sent. Will check lipids today.   Orders: -     Comprehensive metabolic panel -     Lipid panel -     Rosuvastatin Calcium; Take 1 tablet (5 mg total) by mouth every evening.  Dispense: 90 tablet; Refill: 3  Prediabetes Assessment & Plan: Chronic. Stable with diet control. Continue. Will check A1c today. Encouraged healthy diet and exercise.   Orders: -     Hemoglobin A1c  Age-related osteoporosis without current pathological fracture Assessment &  Plan: Taking Fosamax 70 mg weekly, continue. Continue calcium and vitamin D daily supplements. Encouraged weightbearing exercises. Follow up with Endocrinology.   Orders: -     CBC with Differential/Platelet -     VITAMIN D 25 Hydroxy (Vit-D Deficiency, Fractures)  Vitamin D deficiency Assessment & Plan: Chronic. Continue daily supplement. Will check vitamin D level today.   Orders: -     VITAMIN D 25 Hydroxy (Vit-D Deficiency, Fractures)  Thyroid disorder screen -     TSH   Return in about 6 weeks (around 12/18/2022) for [facility] with Dr. Volanda Napoleon.   Tomasita Morrow, NP-C [place]

## 2022-11-06 NOTE — Assessment & Plan Note (Signed)
Chronic. Stable on Crestor 5 mg daily. Continue. Refills sent. Will check lipids today.

## 2022-12-18 ENCOUNTER — Ambulatory Visit: Payer: Commercial Managed Care - PPO | Admitting: Family Medicine

## 2022-12-18 ENCOUNTER — Encounter: Payer: Self-pay | Admitting: Family Medicine

## 2022-12-18 VITALS — BP 108/62 | HR 68 | Ht 59.0 in | Wt 110.0 lb

## 2022-12-18 DIAGNOSIS — E785 Hyperlipidemia, unspecified: Secondary | ICD-10-CM | POA: Diagnosis not present

## 2022-12-18 DIAGNOSIS — Z1231 Encounter for screening mammogram for malignant neoplasm of breast: Secondary | ICD-10-CM | POA: Diagnosis not present

## 2022-12-18 DIAGNOSIS — M8000XD Age-related osteoporosis with current pathological fracture, unspecified site, subsequent encounter for fracture with routine healing: Secondary | ICD-10-CM | POA: Diagnosis not present

## 2022-12-18 DIAGNOSIS — R7303 Prediabetes: Secondary | ICD-10-CM

## 2022-12-18 DIAGNOSIS — E559 Vitamin D deficiency, unspecified: Secondary | ICD-10-CM

## 2022-12-18 DIAGNOSIS — Z1211 Encounter for screening for malignant neoplasm of colon: Secondary | ICD-10-CM

## 2022-12-18 DIAGNOSIS — M810 Age-related osteoporosis without current pathological fracture: Secondary | ICD-10-CM

## 2022-12-18 NOTE — Progress Notes (Signed)
   SUBJECTIVE:   Chief Complaint  Patient presents with   Transfer of Care   HPI Presents to clinic to transfer care.  No acute concerns today.  Osteoporosis. On Fosamax 70 mg weekly and tolerating well.  Started 2 years ago.  Also takes calcium and vitamin D supplements.  Follows with endocrinology in [place].  Reports recent DEXA scan 08/23.   PERTINENT PMH / PSH: Osteoporosis Prediabetes Hyperlipidemia   OBJECTIVE:  BP 108/62   Pulse 68   Ht 4\' 11"  (1.499 m)   Wt 110 lb (49.9 kg)   LMP 05/12/2015   SpO2 98%   BMI 22.22 kg/m    Physical Exam Vitals reviewed.  Constitutional:      General: She is not in acute distress.    Appearance: She is not ill-appearing.  HENT:     Head: Normocephalic.     Nose: Nose normal.  Eyes:     Conjunctiva/sclera: Conjunctivae normal.  Neck:     Thyroid: No thyromegaly or thyroid tenderness.  Cardiovascular:     Rate and Rhythm: Normal rate and regular rhythm.     Heart sounds: Normal heart sounds.  Pulmonary:     Effort: Pulmonary effort is normal.     Breath sounds: Normal breath sounds.  Abdominal:     General: Abdomen is flat. Bowel sounds are normal.     Palpations: Abdomen is soft.  Musculoskeletal:        General: Normal range of motion.     Cervical back: Normal range of motion.  Neurological:     Mental Status: She is alert and oriented to person, place, and time. Mental status is at baseline.  Psychiatric:        Mood and Affect: Mood normal.        Behavior: Behavior normal.        Thought Content: Thought content normal.        Judgment: Judgment normal.     ASSESSMENT/PLAN:  Hyperlipidemia, unspecified hyperlipidemia type Assessment & Plan: Chronic. On statin therapy and tolerating well.  Denies myalgias or joint pain. Continue Crestor 5 mg daily   Prediabetes Assessment & Plan: Chronic.  Recent A1c 6.0. Asymptomatic Check A1c annually   Colon cancer screening -     Cologuard;  Future  Breast cancer screening by mammogram -     3D Screening Mammogram, Left and Right; Future  Osteoporosis with current pathological fracture with routine healing, unspecified osteoporosis type, subsequent encounter Assessment & Plan: Chronic.  On Fosamax and tolerating well. Continue calcium and vitamin D supplements Recommend resistance/weightbearing exercises Follows with endocrinology in [place].   HCM Recommend shingles vaccine Mammogram due.  Referral sent.  Patient to schedule appointment Cologuard up-to-date.  Due 12/24.  Ordered today Last Pap 02/22.  NILM, HPV negative.  Due 02/27, patient will have aged out of cervical cancer screening by due date.  Will discontinue given no previous abnormal Paps. Tetanus up-to-date Recommend pneumonia 20 vaccine  PDMP reviewed  Return in about 1 year (around 12/18/2023) for annual visit with fasting labs 1 week prior.  Dana Allan, MD

## 2022-12-18 NOTE — Patient Instructions (Addendum)
It was a pleasure meeting you today. Thank you for allowing me to take part in your health care.  Our goals for today as we discussed include:  Schedule appointment for annual visit in 1 year.  Please schedule lab appointment 1 week prior to visit.  Referral sent for Mammogram.  Due 05/16/2023.  Please call to schedule appointment. [facility] [address] [place], [place] [address] [phone]   Referral sent for Cologuard.  Due 07/26/2023   If you have any questions or concerns, please do not hesitate to call the office at [phone].  I look forward to our next visit and until then take care and stay safe.  Regards,   Dana Allan, MD   [facility]

## 2023-01-03 DIAGNOSIS — Z1211 Encounter for screening for malignant neoplasm of colon: Secondary | ICD-10-CM | POA: Insufficient documentation

## 2023-01-03 DIAGNOSIS — Z1231 Encounter for screening mammogram for malignant neoplasm of breast: Secondary | ICD-10-CM | POA: Insufficient documentation

## 2023-01-03 NOTE — Assessment & Plan Note (Signed)
Chronic.  On Fosamax and tolerating well. Continue calcium and vitamin D supplements Recommend resistance/weightbearing exercises Follows with endocrinology in [place].

## 2023-01-03 NOTE — Assessment & Plan Note (Signed)
Chronic.  Recent A1c 6.0. Asymptomatic Check A1c annually

## 2023-01-03 NOTE — Assessment & Plan Note (Signed)
Chronic. On statin therapy and tolerating well.  Denies myalgias or joint pain. Continue Crestor 5 mg daily

## 2023-02-02 ENCOUNTER — Other Ambulatory Visit: Payer: Self-pay

## 2023-02-03 ENCOUNTER — Other Ambulatory Visit: Payer: Self-pay

## 2023-03-03 ENCOUNTER — Telehealth: Payer: Self-pay | Admitting: Family Medicine

## 2023-03-03 NOTE — Telephone Encounter (Signed)
Prescription Request  03/03/2023  LOV: 12/18/2022  What is the name of the medication or equipment? alendronate   Have you contacted your pharmacy to request a refill? Yes   Which pharmacy would you like this sent to?  [facility] [address] [place] [address] Phone: [phone] Fax: [phone]    Patient notified that their request is being sent to the clinical staff for review and that they should receive a response within 2 business days.   Please advise at Mobile [phone] (mobile)

## 2023-03-04 ENCOUNTER — Other Ambulatory Visit: Payer: Self-pay

## 2023-03-04 NOTE — Telephone Encounter (Signed)
Called pt and let her know I spoke to pharmacy and they have refills on file and going ahead and refilling it today.

## 2023-03-04 NOTE — Telephone Encounter (Signed)
Left message to return call to our office.  

## 2023-03-04 NOTE — Telephone Encounter (Signed)
Pt called back and I read the message to her and she stated she will talk to her endo about the medication refill

## 2023-03-05 ENCOUNTER — Other Ambulatory Visit: Payer: Self-pay

## 2023-03-06 ENCOUNTER — Other Ambulatory Visit: Payer: Self-pay

## 2023-03-21 DIAGNOSIS — J358 Other chronic diseases of tonsils and adenoids: Secondary | ICD-10-CM | POA: Diagnosis not present

## 2023-05-04 ENCOUNTER — Other Ambulatory Visit: Payer: Self-pay

## 2023-05-08 ENCOUNTER — Ambulatory Visit: Payer: Commercial Managed Care - PPO | Admitting: Internal Medicine

## 2023-05-08 ENCOUNTER — Other Ambulatory Visit: Payer: Self-pay

## 2023-05-08 ENCOUNTER — Encounter: Payer: Self-pay | Admitting: Internal Medicine

## 2023-05-08 VITALS — BP 116/72 | HR 74 | Ht 59.0 in | Wt 109.0 lb

## 2023-05-08 DIAGNOSIS — M810 Age-related osteoporosis without current pathological fracture: Secondary | ICD-10-CM

## 2023-05-08 MED ORDER — ALENDRONATE SODIUM 70 MG PO TABS
70.0000 mg | ORAL_TABLET | ORAL | 3 refills | Status: DC
  Filled 2023-05-08: qty 13, 91d supply, fill #0
  Filled 2023-05-08 – 2023-05-12 (×2): qty 12, 84d supply, fill #0
  Filled 2023-08-11: qty 12, 84d supply, fill #1
  Filled 2023-11-06: qty 12, 84d supply, fill #2

## 2023-05-08 NOTE — Progress Notes (Signed)
Name: Cynthia Zuniga  MRN/ DOB: [id number], 1960-08-30    Age/ Sex: 62 y.o., female    PCP: Dana Allan, MD   Reason for Endocrinology Evaluation: Osteoporosis     Date of Initial Endocrinology Evaluation: 11/06/2021    HPI: [name] is a 62 y.o. female with a past medical history of dyslipidemia and osteoporosis. The patient presented for initial endocrinology clinic visit on 11/06/2021 for consultative assistance with her osteoporosis.   Pt was diagnosed with osteoporosis: 2020 with a T score of -2.9 at the right hip  Menarche at age : 73 Menopausal at age : 21 Fracture Hx: Sacral fracture (2020) fell off step ladder  , Rib fracture 08/2021 following a fall in the bath tub Hx of HRT: no FH of osteoporosis or hip fracture: unknown  Prior Hx of anti-estrogenic therapy :no  Prior Hx of anti-resorptive therapy : She tried fosamax for a couple of times but developed stomach upset in the form of heartburn   Restarted Alendronate 11/2021 without side effects  SUBJECTIVE:    Today (05/08/23): [name] is here for follow-up on osteoporosis management.  Denies heartburn  Denies constipation or diarrhea  No recent falls  Denies bone fracture     Alendronate 70 mg weekly Calcium 1200 mg daily Vitamin D 2000 IU daily    HISTORY:  Past Medical History:  Past Medical History:  Diagnosis Date   Ankle pain, chronic 06/22/2015   Closed fracture of sacrum with routine healing 04/30/2019   COVID-19    08/2020   Hyperlipidemia 07/05/2016   Osteoporosis with current pathological fracture 05/18/2019   Sacral fracture (HCC)    03/23/19   Past Surgical History:  Past Surgical History:  Procedure Laterality Date   APPENDECTOMY  1988    Social History:  reports that she has never smoked. She has never used smokeless tobacco. She reports that she does not drink alcohol and does not use drugs. Family History: family history includes Diabetes in her paternal grandfather;  Heart disease in her father and mother; Hypertension in her paternal grandfather; Stroke in her paternal grandfather.   HOME MEDICATIONS: Allergies as of 05/08/2023   No Known Allergies      Medication List        Accurate as of May 08, 2023  9:41 AM. If you have any questions, ask your nurse or doctor.          alendronate 70 MG tablet Commonly known as: Fosamax Take 1 tablet (70 mg total) by mouth once a week. Take with a full glass of water on an empty stomach.   rosuvastatin 5 MG tablet Commonly known as: CRESTOR Take 1 tablet (5 mg total) by mouth every evening.          REVIEW OF SYSTEMS: A comprehensive ROS was conducted with the patient and is negative except as per HPI      OBJECTIVE:  VS: BP 116/72 (BP Location: Left Arm, Patient Position: Sitting, Cuff Size: Small)   Pulse 74   Ht 4\' 11"  (1.499 m)   Wt 109 lb (49.4 kg)   LMP 05/12/2015   SpO2 99%   BMI 22.02 kg/m    Wt Readings from Last 3 Encounters:  05/08/23 109 lb (49.4 kg)  12/18/22 110 lb (49.9 kg)  11/06/22 109 lb 9.6 oz (49.7 kg)     EXAM: General: Pt appears well and is in NAD  Neck: General: Supple without adenopathy. Thyroid: Thyroid size normal.  No goiter or nodules appreciated.   Lungs: Clear with good BS bilat with no rales, rhonchi, or wheezes  Heart: Auscultation: RRR.  Abdomen: Normoactive bowel sounds, soft, nontender, without masses or organomegaly palpable  Extremities:  BL LE: No pretibial edema normal ROM and strength.  Mental Status: Judgment, insight: Intact Orientation: Oriented to time, place, and person Mood and affect: No depression, anxiety, or agitation     DATA REVIEWED:     Latest Reference Range & Units 11/06/22 08:17  Sodium 135 - 145 mEq/L 138  Potassium 3.5 - 5.1 mEq/L 4.0  Chloride 96 - 112 mEq/L 105  CO2 19 - 32 mEq/L 26  Glucose 70 - 99 mg/dL 95  BUN 6 - 23 mg/dL 13  Creatinine 2.72 - 5.36 mg/dL 6.44  Calcium 8.4 - 03.4 mg/dL 9.5   Alkaline Phosphatase 39 - 117 U/L 59  Albumin 3.5 - 5.2 g/dL 4.4  AST 0 - 37 U/L 24  ALT 0 - 35 U/L 19  Total Protein 6.0 - 8.3 g/dL 7.1  Total Bilirubin 0.2 - 1.2 mg/dL 0.8  GFR >74.25 mL/min 83.11    Latest Reference Range & Units 11/06/22 08:17  TSH 0.35 - 5.50 uIU/mL 1.59     DXA 03/29/2022   DENSITOMETRY RESULTS: Site      Region     Measured Date Measured Age WHO Classification Young Adult T-score BMD         %Change vs. Previous Significant Change (*) DualFemur Neck Left 03/29/2022 63.2 Osteoporosis -2.6 0.678 g/cm2 -1.2% - DualFemur Neck Left 05/07/2019 60.3 Osteoporosis -2.5 0.686 g/cm2 - -   DualFemur Total Mean 03/29/2022 63.2 Osteopenia -1.8 0.781 g/cm2 1.3% - DualFemur Total Mean 05/07/2019 60.3 Osteopenia -1.9 0.771 g/cm2 - -   AP Spine L1-L4 03/29/2022 63.2 Osteopenia -1.7 0.983 g/cm2 2.8% - AP Spine L1-L4 05/07/2019 60.3 Osteopenia -1.9 0.956 g/cm2 - -     ASSESSMENT/PLAN/RECOMMENDATIONS:   Osteoporosis:  -Patient with hx sacral and rib fractures that are NOT considered fragility fractures - I again emphasized the importance of optimizing calcium and vitamin D intake as well as weightbearing exercises -DXA scan 03/2022 continues to show osteoporosis with slight improvement of BMD at the AP spine and left hip -No changes at this time  Medications : Alendronate 70 mg weekly Calcium 1200 mg daily Vitamin D 2000 IU daily   Follow-up in 1 year  Signed electronically by: Lyndle Herrlich, MD  [facility] Endocrinology  [facility] [address] [place] [place], [place] [address] Phone: [phone] FAX: [phone]   CC: Dana Allan, MD [address] [place] [address] Phone: [phone] Fax: [phone]   Return to Endocrinology clinic as below: Future Appointments  Date Time Provider Department Center  12/25/2023  9:15 AM [facility]  01/01/2024  9:40 AM Dana Allan, MD [facility]

## 2023-05-08 NOTE — Patient Instructions (Signed)
Continue Fosamax 70 mg weekly  Calcium 1200 mg daily  Vitamin D 2000 iu daily   Please start Weight bearing  exercise

## 2023-05-12 ENCOUNTER — Other Ambulatory Visit (HOSPITAL_BASED_OUTPATIENT_CLINIC_OR_DEPARTMENT_OTHER): Payer: Self-pay

## 2023-05-12 ENCOUNTER — Other Ambulatory Visit: Payer: Self-pay

## 2023-10-07 ENCOUNTER — Other Ambulatory Visit: Payer: Self-pay | Admitting: Family Medicine

## 2023-10-07 DIAGNOSIS — Z1231 Encounter for screening mammogram for malignant neoplasm of breast: Secondary | ICD-10-CM

## 2023-10-17 ENCOUNTER — Encounter

## 2023-10-28 ENCOUNTER — Encounter

## 2023-11-03 ENCOUNTER — Other Ambulatory Visit: Payer: Self-pay | Admitting: Family Medicine

## 2023-11-03 DIAGNOSIS — E785 Hyperlipidemia, unspecified: Secondary | ICD-10-CM

## 2023-11-03 NOTE — Telephone Encounter (Signed)
 Copied from CRM [id number]. Topic: Clinical - Medication Refill >> Nov 03, 2023  2:18 PM Saverio Danker wrote: Most Recent Primary Care Visit:  Provider: Dana Allan  Department: [facility]  Visit Type: TRANSFER OF CARE  Date: 12/18/2022  Medication: rosuvastatin (CRESTOR) 5 MG tablet  Has the patient contacted their pharmacy? Yes (Agent: If no, request that the patient contact the pharmacy for the refill. If patient does not wish to contact the pharmacy document the reason why and proceed with request.) (Agent: If yes, when and what did the pharmacy advise?)  Is this the correct pharmacy for this prescription? Yes If no, delete pharmacy and type the correct one.  This is the patient's preferred pharmacy:  [facility] [address] [place] [address] Phone: [phone] Fax: [phone]   Has the prescription been filled recently? No  Is the patient out of the medication? No  Has the patient been seen for an appointment in the last year OR does the patient have an upcoming appointment? Yes  Can we respond through MyChart? Yes  Agent: Please be advised that Rx refills may take up to 3 business days. We ask that you follow-up with your pharmacy.

## 2023-11-04 ENCOUNTER — Other Ambulatory Visit: Payer: Self-pay

## 2023-11-04 MED ORDER — ROSUVASTATIN CALCIUM 5 MG PO TABS
5.0000 mg | ORAL_TABLET | Freq: Every evening | ORAL | 3 refills | Status: DC
Start: 2023-11-04 — End: 2024-01-06
  Filled 2023-11-04: qty 90, 90d supply, fill #0

## 2023-11-12 ENCOUNTER — Ambulatory Visit
Admission: RE | Admit: 2023-11-12 | Discharge: 2023-11-12 | Disposition: A | Discharge status: Home / Self Care | Source: Ambulatory Visit | Attending: Family Medicine | Admitting: Family Medicine

## 2023-11-12 DIAGNOSIS — Z1231 Encounter for screening mammogram for malignant neoplasm of breast: Secondary | ICD-10-CM | POA: Insufficient documentation

## 2023-12-16 ENCOUNTER — Other Ambulatory Visit: Payer: Self-pay | Admitting: Family Medicine

## 2023-12-16 ENCOUNTER — Telehealth: Payer: Self-pay | Admitting: Family Medicine

## 2023-12-16 DIAGNOSIS — E559 Vitamin D deficiency, unspecified: Secondary | ICD-10-CM

## 2023-12-16 DIAGNOSIS — E785 Hyperlipidemia, unspecified: Secondary | ICD-10-CM

## 2023-12-16 DIAGNOSIS — R7309 Other abnormal glucose: Secondary | ICD-10-CM

## 2023-12-16 DIAGNOSIS — E538 Deficiency of other specified B group vitamins: Secondary | ICD-10-CM

## 2023-12-16 NOTE — Telephone Encounter (Signed)
 Patient need lab orders.

## 2023-12-25 ENCOUNTER — Ambulatory Visit: Payer: Self-pay | Admitting: Family Medicine

## 2023-12-25 ENCOUNTER — Other Ambulatory Visit (INDEPENDENT_AMBULATORY_CARE_PROVIDER_SITE_OTHER): Payer: Commercial Managed Care - PPO

## 2023-12-25 DIAGNOSIS — E538 Deficiency of other specified B group vitamins: Secondary | ICD-10-CM

## 2023-12-25 DIAGNOSIS — E559 Vitamin D deficiency, unspecified: Secondary | ICD-10-CM | POA: Diagnosis not present

## 2023-12-25 DIAGNOSIS — R7309 Other abnormal glucose: Secondary | ICD-10-CM | POA: Diagnosis not present

## 2023-12-25 DIAGNOSIS — E785 Hyperlipidemia, unspecified: Secondary | ICD-10-CM | POA: Diagnosis not present

## 2023-12-25 LAB — VITAMIN B12: Vitamin B-12: 413 pg/mL (ref 211–911)

## 2023-12-25 LAB — COMPREHENSIVE METABOLIC PANEL WITH GFR
ALT: 18 U/L (ref 0–35)
AST: 21 U/L (ref 0–37)
Albumin: 4.6 g/dL (ref 3.5–5.2)
Alkaline Phosphatase: 53 U/L (ref 39–117)
BUN: 14 mg/dL (ref 6–23)
CO2: 32 meq/L (ref 19–32)
Calcium: 9.2 mg/dL (ref 8.4–10.5)
Chloride: 100 meq/L (ref 96–112)
Creatinine, Ser: 0.64 mg/dL (ref 0.40–1.20)
GFR: 92.99 mL/min (ref >60.00)
Glucose, Bld: 93 mg/dL (ref 70–99)
Potassium: 4.1 meq/L (ref 3.5–5.1)
Sodium: 138 meq/L (ref 135–145)
Total Bilirubin: 1 mg/dL (ref 0.2–1.2)
Total Protein: 7.3 g/dL (ref 6.0–8.3)

## 2023-12-25 LAB — LIPID PANEL
Cholesterol: 183 mg/dL (ref 0–200)
HDL: 71.7 mg/dL (ref >39.00)
LDL Cholesterol: 100 mg/dL — ABNORMAL HIGH (ref 0–99)
NonHDL: 111.07
Total CHOL/HDL Ratio: 3
Triglycerides: 53 mg/dL (ref 0.0–149.0)
VLDL: 10.6 mg/dL (ref 0.0–40.0)

## 2023-12-25 LAB — VITAMIN D 25 HYDROXY (VIT D DEFICIENCY, FRACTURES): VITD: 42.88 ng/mL (ref 30.00–100.00)

## 2023-12-25 LAB — HEMOGLOBIN A1C: Hgb A1c MFr Bld: 6.1 % (ref 4.6–6.5)

## 2024-01-01 ENCOUNTER — Encounter: Payer: Commercial Managed Care - PPO | Admitting: Family Medicine

## 2024-01-06 ENCOUNTER — Other Ambulatory Visit: Payer: Self-pay

## 2024-01-06 ENCOUNTER — Ambulatory Visit (INDEPENDENT_AMBULATORY_CARE_PROVIDER_SITE_OTHER): Admitting: Family Medicine

## 2024-01-06 ENCOUNTER — Encounter: Payer: Self-pay | Admitting: Family Medicine

## 2024-01-06 VITALS — BP 124/70 | HR 63 | Temp 98.3°F | Resp 20 | Ht 59.75 in | Wt 109.4 lb

## 2024-01-06 DIAGNOSIS — Z0000 Encounter for general adult medical examination without abnormal findings: Secondary | ICD-10-CM | POA: Diagnosis not present

## 2024-01-06 DIAGNOSIS — E785 Hyperlipidemia, unspecified: Secondary | ICD-10-CM | POA: Diagnosis not present

## 2024-01-06 DIAGNOSIS — Z1211 Encounter for screening for malignant neoplasm of colon: Secondary | ICD-10-CM | POA: Diagnosis not present

## 2024-01-06 DIAGNOSIS — M8000XD Age-related osteoporosis with current pathological fracture, unspecified site, subsequent encounter for fracture with routine healing: Secondary | ICD-10-CM | POA: Diagnosis not present

## 2024-01-06 MED ORDER — ROSUVASTATIN CALCIUM 5 MG PO TABS: 5.0000 mg | ORAL_TABLET | Freq: Every evening | ORAL | 3 refills | Status: DC

## 2024-01-06 MED ORDER — ROSUVASTATIN CALCIUM 5 MG PO TABS
5.0000 mg | ORAL_TABLET | Freq: Every evening | ORAL | 3 refills | Status: AC
Start: 2024-01-06 — End: 2025-01-05

## 2024-01-06 MED ORDER — ALENDRONATE SODIUM 70 MG PO TABS: 70.0000 mg | ORAL_TABLET | ORAL | 1 refills | Status: DC

## 2024-01-06 NOTE — Patient Instructions (Addendum)
 It was a pleasure meeting you today. Thank you for allowing me to take part in your health care.  Our goals for today as we discussed include:  Refills sent for requested medication   This is a list of the screening recommended for you and due dates:  Health Maintenance  Topic Date Due   Medicare Annual Wellness Visit  Never done   Pneumonia Vaccine (1 of 1 - PCV) Never done   Zoster (Shingles) Vaccine (1 of 2) Never done   Cologuard (Stool DNA test)  07/26/2023   Flu Shot  03/05/2024   Mammogram  11/11/2024   Pap with HPV screening  09/21/2025   DTaP/Tdap/Td vaccine (2 - Td or Tdap) 09/21/2030   DEXA scan (bone density measurement)  Completed   Hepatitis C Screening  Completed   HPV Vaccine  Aged Out   Meningitis B Vaccine  Aged Out   Colon Cancer Screening  Discontinued   COVID-19 Vaccine  Discontinued      If you have any questions or concerns, please do not hesitate to call the office at [phone].  I look forward to our next visit and until then take care and stay safe.  Regards,   Valli Gaw, MD   [facility]

## 2024-01-08 ENCOUNTER — Encounter: Payer: Self-pay | Admitting: Family Medicine

## 2024-01-08 DIAGNOSIS — Z0000 Encounter for general adult medical examination without abnormal findings: Secondary | ICD-10-CM | POA: Insufficient documentation

## 2024-01-08 NOTE — Assessment & Plan Note (Signed)
 Due for routine screenings and vaccinations.  - Mammogram due April 2026. Referral sent. - Recommend regular self breast exams - Colon cancer screening due. Cologuard test. - Cervical cancer screening not indicated.  Age > 65.  No previous abnormal PAP - Recommend pneumonia vaccine (Pneumovax 20) at pharmacy. - Annual labs reviewed - Depression screening negative - Normotensive - Schedule Medicare wellness visit.

## 2024-01-08 NOTE — Progress Notes (Signed)
 SUBJECTIVE:   Chief Complaint  Patient presents with   Annual Exam   HPI Presents for annual physical  Discussed the use of AI scribe software for clinical note transcription with the patient, who gave verbal consent to proceed.  History of Present Illness Cynthia Zuniga is a 63 year old female who presents for an annual physical exam.  Cholesterol levels have remained stable at 100 mg/dL over the past year despite being on a 5 mg dose of cholesterol medication. She has been on cholesterol medication for at least five years, starting at 20 mg, then reducing to 10 mg, and currently at 5 mg. No recent dietary changes have been made.  No history of high blood pressure, diabetes, heart attack, or stroke. No chest pain, shortness of breath, constipation, or any other significant symptoms.  No family history of breast cancer. She performs self-breast exams and has no history of fibrocystic or dense breasts.  Completed a Cologuard test last year but did not receive the results. No history of colon polyps or colon cancer.  Last Pap smear was approximately three years ago, with no history of abnormal Pap smears or hysterectomy. No vaginal bleeding reported.  Recently started Medicare.     PERTINENT PMH / PSH: As above  OBJECTIVE:  BP 124/70   Pulse 63   Temp 98.3 F (36.8 C)   Resp 20   Ht 4' 11.75" (1.518 m)   Wt 109 lb 6 oz (49.6 kg)   LMP 05/12/2015   SpO2 99%   BMI 21.54 kg/m    Physical Exam Vitals reviewed.  Constitutional:      General: She is not in acute distress.    Appearance: She is not ill-appearing.  HENT:     Head: Normocephalic.     Right Ear: Tympanic membrane, ear canal and external ear normal.     Left Ear: Tympanic membrane, ear canal and external ear normal.     Nose: Nose normal.     Mouth/Throat:     Mouth: Mucous membranes are moist.  Eyes:     Extraocular Movements: Extraocular movements intact.     Conjunctiva/sclera: Conjunctivae  normal.     Pupils: Pupils are equal, round, and reactive to light.  Neck:     Thyroid : No thyromegaly or thyroid  tenderness.     Vascular: No carotid bruit.  Cardiovascular:     Rate and Rhythm: Normal rate and regular rhythm.     Pulses: Normal pulses.     Heart sounds: Normal heart sounds.  Pulmonary:     Effort: Pulmonary effort is normal.     Breath sounds: Normal breath sounds.  Abdominal:     General: Bowel sounds are normal. There is no distension.     Palpations: Abdomen is soft.     Tenderness: There is no abdominal tenderness. There is no right CVA tenderness, left CVA tenderness, guarding or rebound.  Musculoskeletal:        General: Normal range of motion.     Cervical back: Normal range of motion.     Right lower leg: No edema.     Left lower leg: No edema.  Lymphadenopathy:     Cervical: No cervical adenopathy.  Skin:    Capillary Refill: Capillary refill takes less than 2 seconds.  Neurological:     General: No focal deficit present.     Mental Status: She is alert and oriented to person, place, and time. Mental status is at baseline.  Motor: No weakness.  Psychiatric:        Mood and Affect: Mood normal.        Behavior: Behavior normal.        Thought Content: Thought content normal.        Judgment: Judgment normal.           01/06/2024   10:15 AM 12/18/2022    9:46 AM 11/06/2022    8:08 AM 08/22/2021    9:06 AM 06/23/2020    2:02 PM  Depression screen PHQ 2/9  Decreased Interest 0 0 1 0 0  Down, Depressed, Hopeless 0 0 0 0 0  PHQ - 2 Score 0 0 1 0 0  Altered sleeping 0 0 0  0  Tired, decreased energy 0 0 0  0  Change in appetite 0 0 0  0  Feeling bad or failure about yourself  0 0 0  0  Trouble concentrating 0 0 0  0  Moving slowly or fidgety/restless 0 0 0  0  Suicidal thoughts 0 0 0  0  PHQ-9 Score 0 0 1  0  Difficult doing work/chores Not difficult at all Not difficult at all Not difficult at all  Not difficult at all      01/06/2024    10:15 AM 12/18/2022    9:47 AM 11/06/2022    8:08 AM  GAD 7 : Generalized Anxiety Score  Nervous, Anxious, on Edge 0 0 0  Control/stop worrying 0 0 0  Worry too much - different things 0 0 0  Trouble relaxing 0 0 0  Restless 0 0 0  Easily annoyed or irritable 0 0 0  Afraid - awful might happen 0 0 0  Total GAD 7 Score 0 0 0  Anxiety Difficulty Not difficult at all Not difficult at all Not difficult at all    ASSESSMENT/PLAN:  Annual physical exam Assessment & Plan: Due for routine screenings and vaccinations.  - Mammogram due April 2026. Referral sent. - Recommend regular self breast exams - Colon cancer screening due. Cologuard test. - Cervical cancer screening not indicated.  Age > 65.  No previous abnormal PAP - Recommend pneumonia vaccine (Pneumovax 20) at pharmacy. - Annual labs reviewed - Depression screening negative - Normotensive - Schedule Medicare wellness visit.   Hyperlipidemia, unspecified hyperlipidemia type Assessment & Plan: Tolerating statin therapy - Refill Crestor  5 mg. - Encourage dietary modifications.  Orders: -     Rosuvastatin  Calcium ; Take 1 tablet (5 mg total) by mouth every evening.  Dispense: 90 tablet; Refill: 3  Colon cancer screening -     Cologuard  Osteoporosis with current pathological fracture with routine healing, unspecified osteoporosis type, subsequent encounter Assessment & Plan: Chronic.  On Fosamax  and tolerating well. Continue calcium  and vitamin D  supplements Recommend resistance/weightbearing exercises Last DEXA 03/2022.   Follows with endocrinology in [place].      PDMP reviewed  Return if symptoms worsen or fail to improve, for PCP.  Valli Gaw, MD

## 2024-01-08 NOTE — Assessment & Plan Note (Signed)
 Chronic.  On Fosamax  and tolerating well. Continue calcium  and vitamin D  supplements Recommend resistance/weightbearing exercises Last DEXA 03/2022.   Follows with endocrinology in [place].

## 2024-01-08 NOTE — Assessment & Plan Note (Signed)
 Tolerating statin therapy - Refill Crestor  5 mg. - Encourage dietary modifications.

## 2024-02-10 DIAGNOSIS — Z1211 Encounter for screening for malignant neoplasm of colon: Secondary | ICD-10-CM | POA: Diagnosis not present

## 2024-02-16 DIAGNOSIS — H259 Unspecified age-related cataract: Secondary | ICD-10-CM | POA: Diagnosis not present

## 2024-02-18 LAB — COLOGUARD: COLOGUARD: NEGATIVE

## 2024-02-19 ENCOUNTER — Ambulatory Visit: Payer: Self-pay | Admitting: Family

## 2024-02-23 ENCOUNTER — Other Ambulatory Visit: Payer: Self-pay | Admitting: Nurse Practitioner

## 2024-02-23 DIAGNOSIS — E785 Hyperlipidemia, unspecified: Secondary | ICD-10-CM

## 2024-02-23 NOTE — Telephone Encounter (Signed)
 Copied from CRM [id number]. Topic: Clinical - Medication Refill >> Feb 23, 2024 11:41 AM Delon T wrote: Medication: alendronate  (FOSAMAX ) 70 MG tablet rosuvastatin  (CRESTOR ) 5 MG tablet  Has the patient contacted their pharmacy? No (Agent: If no, request that the patient contact the pharmacy for the refill. If patient does not wish to contact the pharmacy document the reason why and proceed with request.) (Agent: If yes, when and what did the pharmacy advise?)  This is the patient's preferred pharmacy:  [id number] - [place], [place] - [address] AT [facility] [address] [facility] [address] [place] [address] Phone: [phone] Fax: [phone]    Is this the correct pharmacy for this prescription? No If no, delete pharmacy and type the correct one.   Has the prescription been filled recently? Yes  Is the patient out of the medication? No  Has the patient been seen for an appointment in the last year OR does the patient have an upcoming appointment? Yes  Can we respond through MyChart? Yes  Agent: Please be advised that Rx refills may take up to 3 business days. We ask that you follow-up with your pharmacy.

## 2024-03-01 ENCOUNTER — Telehealth: Payer: Self-pay

## 2024-03-01 NOTE — Telephone Encounter (Signed)
 Noted will be on the look out of surgical clearance paperwork

## 2024-03-01 NOTE — Telephone Encounter (Signed)
 Copied from CRM [id number]. Topic: General - Other >> Mar 01, 2024 11:25 AM Revonda D wrote: Reason for CRM: Dr.Dunlevy is requesting the pt's medical records and medical clearance for a dental surgery. He stated that he would give the pt a call to inform her to call the office to schedule her medical clearance appt. He stated that he would be faxing over some documents in regards to the medical clearance. He stated that he needs the requested documents faxed to [phone]  and they can be faxed once the pt has been in the office for the medical clearance.

## 2024-03-03 NOTE — Telephone Encounter (Signed)
 Have we seen the medical clearance yet? I have not received anything in [place] [address].

## 2024-03-22 DIAGNOSIS — D104 Benign neoplasm of tonsil: Secondary | ICD-10-CM | POA: Diagnosis not present

## 2024-04-27 ENCOUNTER — Encounter: Admitting: Nurse Practitioner

## 2024-05-06 ENCOUNTER — Encounter: Payer: Self-pay | Admitting: Internal Medicine

## 2024-05-06 ENCOUNTER — Ambulatory Visit: Payer: Commercial Managed Care - PPO | Admitting: Internal Medicine

## 2024-05-06 VITALS — BP 110/72 | HR 71 | Ht 59.75 in | Wt 107.6 lb

## 2024-05-06 DIAGNOSIS — M810 Age-related osteoporosis without current pathological fracture: Secondary | ICD-10-CM | POA: Diagnosis not present

## 2024-05-06 MED ORDER — ALENDRONATE SODIUM 70 MG PO TABS: 70.0000 mg | ORAL_TABLET | ORAL | 3 refills | Status: AC

## 2024-05-06 NOTE — Progress Notes (Signed)
 Name: Cynthia Zuniga  MRN/ DOB: [id number], Nov 11, 1960    Age/ Sex: 63 y.o., female    PCP: No primary care provider on file.   Reason for Endocrinology Evaluation: Osteoporosis     Date of Initial Endocrinology Evaluation: 11/06/2021    HPI: [name] is a 63 y.o. female with a past medical history of dyslipidemia and osteoporosis. The patient presented for initial endocrinology clinic visit on 11/06/2021 for consultative assistance with her osteoporosis.   Pt was diagnosed with osteoporosis: 2020 with a T score of -2.9 at the right hip  Menarche at age : 38 Menopausal at age : 43 Fracture Hx: Sacral fracture (2020) fell off step ladder  , Rib fracture 08/2021 following a fall in the bath tub Hx of HRT: no FH of osteoporosis or hip fracture: unknown  Prior Hx of anti-estrogenic therapy :no  Prior Hx of anti-resorptive therapy : She tried fosamax  for a couple of times but developed stomach upset in the form of heartburn   Restarted Alendronate  11/2021 without side effects  SUBJECTIVE:    Today (05/06/24): Cynthia Zuniga is here for follow-up on osteoporosis management.   No heartburn or nausea  No constipation or diarrhea  No falls     Alendronate  70 mg weekly Calcium  500 mg BID Vitamin D  2000 IU daily    HISTORY:  Past Medical History:  Past Medical History:  Diagnosis Date   Ankle pain, chronic 06/22/2015   Closed fracture of sacrum with routine healing 04/30/2019   COVID-19    08/2020   Hyperlipidemia 07/05/2016   Osteoporosis with current pathological fracture 05/18/2019   Sacral fracture (HCC)    03/23/19   Past Surgical History:  Past Surgical History:  Procedure Laterality Date   APPENDECTOMY  1988    Social History:  reports that she has never smoked. She has never used smokeless tobacco. She reports that she does not drink alcohol and does not use drugs. Family History: family history includes Diabetes in her paternal grandfather; Heart  disease in her father and mother; Hypertension in her paternal grandfather; Stroke in her paternal grandfather.   HOME MEDICATIONS: Allergies as of 05/06/2024   No Known Allergies      Medication List        Accurate as of May 06, 2024  7:06 AM. If you have any questions, ask your nurse or doctor.          alendronate  70 MG tablet Commonly known as: Fosamax  Take 1 tablet (70 mg total) by mouth once a week. Take with a full glass of water on an empty stomach.   rosuvastatin  5 MG tablet Commonly known as: CRESTOR  Take 1 tablet (5 mg total) by mouth every evening.          REVIEW OF SYSTEMS: A comprehensive ROS was conducted with the patient and is negative except as per HPI      OBJECTIVE:  VS: LMP 05/12/2015    Wt Readings from Last 3 Encounters:  01/06/24 109 lb 6 oz (49.6 kg)  05/08/23 109 lb (49.4 kg)  12/18/22 110 lb (49.9 kg)     EXAM: General: Pt appears well and is in NAD  Neck: General: Supple without adenopathy. Thyroid : Thyroid  size normal.  No goiter or nodules appreciated.   Lungs: Clear with good BS bilat with no rales, rhonchi, or wheezes  Heart: Auscultation: RRR.  Abdomen: Normoactive bowel sounds, soft, nontender, without masses or organomegaly palpable  Extremities:  BL LE:  No pretibial edema normal ROM and strength.  Mental Status: Judgment, insight: Intact Orientation: Oriented to time, place, and person Mood and affect: No depression, anxiety, or agitation    DATA REVIEWED:   Latest Reference Range & Units 12/25/23 08:45  Sodium 135 - 145 mEq/L 138  Potassium 3.5 - 5.1 mEq/L 4.1  Chloride 96 - 112 mEq/L 100  CO2 19 - 32 mEq/L 32  Glucose 70 - 99 mg/dL 93  BUN 6 - 23 mg/dL 14  Creatinine 9.59 - 8.79 mg/dL 9.35  Calcium  8.4 - 10.5 mg/dL 9.2  Alkaline Phosphatase 39 - 117 U/L 53  Albumin 3.5 - 5.2 g/dL 4.6  AST 0 - 37 U/L 21  ALT 0 - 35 U/L 18  Total Protein 6.0 - 8.3 g/dL 7.3  Total Bilirubin 0.2 - 1.2 mg/dL 1.0  GFR  >39.99 mL/min 92.99    Latest Reference Range & Units 12/25/23 08:45  VITD 30.00 - 100.00 ng/mL 42.88  Vitamin B12 211 - 911 pg/mL 413    DXA 03/29/2022 @[facility]   DENSITOMETRY RESULTS: Site      Region     Measured Date Measured Age WHO Classification Young Adult T-score BMD         %Change vs. Previous Significant Change (*) DualFemur Neck Left 03/29/2022 63.2 Osteoporosis -2.6 0.678 g/cm2 -1.2% - DualFemur Neck Left 05/07/2019 60.3 Osteoporosis -2.5 0.686 g/cm2 - -   DualFemur Total Mean 03/29/2022 63.2 Osteopenia -1.8 0.781 g/cm2 1.3% - DualFemur Total Mean 05/07/2019 60.3 Osteopenia -1.9 0.771 g/cm2 - -   AP Spine L1-L4 03/29/2022 63.2 Osteopenia -1.7 0.983 g/cm2 2.8% - AP Spine L1-L4 05/07/2019 60.3 Osteopenia -1.9 0.956 g/cm2 - -     ASSESSMENT/PLAN/RECOMMENDATIONS:   Osteoporosis:  -Patient with hx sacral and rib fractures that are NOT considered fragility fractures - We again emphasized the importance of optimizing calcium  and vitamin D  intake -She was encouraged again to start weightbearing exercises, as she has not done so -DXA scan 03/2022 continues to show osteoporosis with slight improvement of BMD at the AP spine and left hip - She is due for repeat DXA scan, but the patient did not want us  to schedule this yet, patient would like to check with her insurance prior to scheduling - I have advised the patient that the bone density scan order has been entered into her chart, and she may contact [facility] to schedule - Alendronate  was refilled  Medications : Alendronate  70 mg weekly Calcium  1200 mg daily Vitamin D  2000 IU daily   Follow-up in 1 year  Signed electronically by: Stefano Redgie Butts, MD  [facility] End[facility]  [facility] [address] [place], [place] [address] Phone: [phone] FAX: [phone]   CC: No primary care provider on file. No primary provider on  file. Phone: None Fax: None   Return to Endocrinology clinic as below: Future Appointments  Date Time Provider Department Center  05/06/2024  9:30 AM Azriella Mattia, Donell Redgie, MD [facility] None  06/21/2024 11:00 AM Abbey Bruckner, MD [facility] [address]  08/17/2024  9:40 AM Lester, Kacy, NP [facility] [address]

## 2024-05-06 NOTE — Patient Instructions (Signed)
Continue Fosamax 70 mg weekly  Calcium 1200 mg daily  Vitamin D 2000 iu daily   Please start Weight bearing  exercise

## 2024-06-02 ENCOUNTER — Telehealth: Payer: Self-pay

## 2024-06-02 NOTE — Telephone Encounter (Signed)
 Lm that Dr Abbey will be out of the office on 06/21/24 and moved appointment to 07/05/2024 at 1pm. MyChart message also sent to patient.

## 2024-06-03 ENCOUNTER — Ambulatory Visit
Admission: RE | Admit: 2024-06-03 | Discharge: 2024-06-03 | Disposition: A | Discharge status: Home / Self Care | Source: Ambulatory Visit | Attending: Internal Medicine | Admitting: Internal Medicine

## 2024-06-03 DIAGNOSIS — Z780 Asymptomatic menopausal state: Secondary | ICD-10-CM | POA: Diagnosis not present

## 2024-06-03 DIAGNOSIS — M8589 Other specified disorders of bone density and structure, multiple sites: Secondary | ICD-10-CM | POA: Diagnosis not present

## 2024-06-03 DIAGNOSIS — M810 Age-related osteoporosis without current pathological fracture: Secondary | ICD-10-CM | POA: Insufficient documentation

## 2024-06-04 ENCOUNTER — Ambulatory Visit: Payer: Self-pay | Admitting: Internal Medicine

## 2024-06-21 ENCOUNTER — Encounter

## 2024-07-05 ENCOUNTER — Encounter

## 2024-08-17 ENCOUNTER — Encounter: Admitting: Nurse Practitioner

## 2024-09-01 ENCOUNTER — Ambulatory Visit (INDEPENDENT_AMBULATORY_CARE_PROVIDER_SITE_OTHER): Admitting: Internal Medicine

## 2024-09-01 ENCOUNTER — Encounter: Payer: Self-pay | Admitting: Internal Medicine

## 2024-09-01 VITALS — BP 138/84 | HR 76 | Temp 97.8°F | Ht 59.75 in | Wt 110.8 lb

## 2024-09-01 DIAGNOSIS — Z0000 Encounter for general adult medical examination without abnormal findings: Secondary | ICD-10-CM

## 2024-09-01 NOTE — Progress Notes (Signed)
 "  Chief Complaint  Patient presents with   welcome to medicare     Subjective:   Cynthia Zuniga is a 64 y.o. female who presents for a Welcome to Medicare Exam.   Fall Screening Falls in the past year?: 0 Number of falls in past year: 0 Was there an injury with Fall?: 0 Fall Risk Category Calculator: 0 Patient Fall Risk Level: Low Fall Risk  Fall Risk Patient at Risk for Falls Due to: No Fall Risks Fall risk Follow up: Falls evaluation completed; Education provided    Allergies (verified) Patient has no known allergies.   Current Medications (verified) Outpatient Encounter Medications as of 09/01/2024  Medication Sig   alendronate  (FOSAMAX ) 70 MG tablet Take 1 tablet (70 mg total) by mouth once a week. Take with a full glass of water on an empty stomach.   rosuvastatin  (CRESTOR ) 5 MG tablet Take 1 tablet (5 mg total) by mouth every evening.   No facility-administered encounter medications on file as of 09/01/2024.    History: Past Medical History:  Diagnosis Date   Ankle pain, chronic 06/22/2015   Closed fracture of sacrum with routine healing 04/30/2019   COVID-19    08/2020   Hyperlipidemia 07/05/2016   Osteoporosis with current pathological fracture 05/18/2019   Sacral fracture (HCC)    03/23/19   Past Surgical History:  Procedure Laterality Date   APPENDECTOMY  1988   Family History  Problem Relation Age of Onset   Heart disease Mother    Heart disease Father    Stroke Paternal Grandfather    Hypertension Paternal Grandfather    Diabetes Paternal Grandfather    Breast cancer Neg Hx    Social History   Occupational History   Not on file  Tobacco Use   Smoking status: Never   Smokeless tobacco: Never  Substance and Sexual Activity   Alcohol use: No   Drug use: No   Sexual activity: Not on file   Tobacco Counseling Counseling given: Not Answered  SDOH Screenings   Food Insecurity: No Food Insecurity (09/01/2024)  Housing: Low Risk (09/01/2024)   Transportation Needs: No Transportation Needs (09/01/2024)  Utilities: Not At Risk (09/01/2024)  Alcohol Screen: Low Risk (12/18/2022)  Depression (PHQ2-9): Low Risk (09/01/2024)  Financial Resource Strain: Low Risk (12/18/2022)  Physical Activity: Insufficiently Active (09/01/2024)  Social Connections: Socially Integrated (09/01/2024)  Stress: No Stress Concern Present (09/01/2024)  Tobacco Use: Low Risk (09/01/2024)  Health Literacy: Adequate Health Literacy (09/01/2024)   See flowsheets for full screening details  Depression Screen PHQ 2 & 9 Depression Scale- Over the past 2 weeks, how often have you been bothered by any of the following problems? Little interest or pleasure in doing things: 0 Feeling down, depressed, or hopeless (PHQ Adolescent also includes...irritable): 0 PHQ-2 Total Score: 0 Trouble falling or staying asleep, or sleeping too much: 0 Feeling tired or having little energy: 0 Poor appetite or overeating (PHQ Adolescent also includes...weight loss): 0 Feeling bad about yourself - or that you are a failure or have let yourself or your family down: 0 Trouble concentrating on things, such as reading the newspaper or watching television (PHQ Adolescent also includes...like school work): 0 Moving or speaking so slowly that other people could have noticed. Or the opposite - being so fidgety or restless that you have been moving around a lot more than usual: 0 Thoughts that you would be better off dead, or of hurting yourself in some way: 0 PHQ-9 Total Score: 0  If you checked off any problems, how difficult have these problems made it for you to do your work, take care of things at home, or get along with other people?: Not difficult at all      Goals Addressed   None          Objective:    Today's Vitals   09/01/24 1454  BP: 138/84  Pulse: 76  Temp: 97.8 F (36.6 C)  SpO2: 98%  Weight: 110 lb 12.8 oz (50.3 kg)  Height: 4' 11.75 (1.518 m)   Body mass index is  21.82 kg/m.   Physical Exam Constitutional:      Appearance: Normal appearance.  HENT:     Head: Normocephalic and atraumatic.  Cardiovascular:     Rate and Rhythm: Normal rate and regular rhythm.     Heart sounds: Normal heart sounds.  Pulmonary:     Effort: Pulmonary effort is normal.     Breath sounds: Normal breath sounds. No wheezing, rhonchi or rales.  Abdominal:     General: Bowel sounds are normal. There is no distension.     Palpations: Abdomen is soft.     Tenderness: There is no abdominal tenderness. There is no guarding or rebound.  Musculoskeletal:        General: No swelling or tenderness.     Right lower leg: No edema.     Left lower leg: No edema.  Neurological:     Mental Status: She is alert.  Psychiatric:        Mood and Affect: Mood normal.        Behavior: Behavior normal.       Hearing/Vision screen Vision Screening   Right eye Left eye Both eyes  Without correction     With correction 20/25 20/30 20/25    Immunizations and Health Maintenance Health Maintenance  Topic Date Due   Pneumococcal Vaccine: 50+ Years (1 of 1 - PCV) Never done   Zoster Vaccines- Shingrix (1 of 2) Never done   Mammogram  11/11/2024   Medicare Annual Wellness (AWV)  09/01/2025   Fecal DNA (Cologuard)  02/10/2027   DTaP/Tdap/Td (2 - Td or Tdap) 09/21/2030   Influenza Vaccine  Completed   Bone Density Scan  Completed   Hepatitis C Screening  Completed   Hepatitis B Vaccines 19-59 Average Risk  Aged Out   Meningococcal B Vaccine  Aged Out   Colonoscopy  Discontinued   COVID-19 Vaccine  Discontinued    EKG: there are no previous tracings available for comparison, nonspecific ST and T waves changes.  Patient has no cardiac symptoms (no chest pain, no shortness of breath, no epigastric pain)     Assessment/Plan:  This is a routine wellness examination for Cynthia Zuniga.  Patient Care Team: Bair, Kalpana, MD as PCP - General (Family Medicine)  I have personally reviewed  and noted the following in the patients chart:   Medical and social history Use of alcohol, tobacco or illicit drugs  Current medications and supplements including opioid prescriptions. Functional ability and status Nutritional status Physical activity Advanced directives List of other physicians Hospitalizations, surgeries, and ER visits in previous 12 months Vitals Screenings to include cognitive, depression, and falls Referrals and appointments  Orders Placed This Encounter  Procedures   EKG 12-Lead   In addition, I have reviewed and discussed with patient certain preventive protocols, quality metrics, and best practice recommendations. A written personalized care plan for preventive services as well as general preventive health recommendations were  provided to patient.  Dr. Gaege Sangalang  09/01/2024   Return in 1 year (on 09/01/2025).  "

## 2024-09-01 NOTE — Patient Instructions (Signed)
 Cynthia Zuniga,  Thank you for taking the time for your Medicare Wellness Visit. I appreciate your continued commitment to your health goals. Please review the care plan we discussed, and feel free to reach out if I can assist you further.  Please note that Annual Wellness Visits do not include a physical exam. Some assessments may be limited, especially if the visit was conducted virtually. If needed, we may recommend an in-person follow-up with your provider.  Ongoing Care Seeing your primary care provider every 3 to 6 months helps us  monitor your health and provide consistent, personalized care.   Referrals If a referral was made during today's visit and you haven't received any updates within two weeks, please contact the referred provider directly to check on the status.  Recommended Screenings:  Health Maintenance  Topic Date Due   Pneumococcal Vaccine for age over 73 (1 of 1 - PCV) Never done   Zoster (Shingles) Vaccine (1 of 2) Never done   Breast Cancer Screening  11/11/2024   Medicare Annual Wellness Visit  09/01/2025   Cologuard (Stool DNA test)  02/10/2027   DTaP/Tdap/Td vaccine (2 - Td or Tdap) 09/21/2030   Flu Shot  Completed   Osteoporosis screening with Bone Density Scan  Completed   Hepatitis C Screening  Completed   Hepatitis B Vaccine  Aged Out   Meningitis B Vaccine  Aged Out   Colon Cancer Screening  Discontinued   COVID-19 Vaccine  Discontinued        No data to display          Vision: Annual vision screenings are recommended for early detection of glaucoma, cataracts, and diabetic retinopathy. These exams can also reveal signs of chronic conditions such as diabetes and high blood pressure.  Dental: Annual dental screenings help detect early signs of oral cancer, gum disease, and other conditions linked to overall health, including heart disease and diabetes.  Please see the attached documents for additional preventive care recommendations.

## 2024-10-20 ENCOUNTER — Encounter

## 2025-05-06 ENCOUNTER — Ambulatory Visit: Admitting: Internal Medicine
